# Patient Record
Sex: Female | Born: 1985 | Race: White | Hispanic: No | Marital: Single | Smoking: Never smoker
Health system: Southern US, Community
[De-identification: ages and names within clinical notes are randomized; demographics above are authoritative.]

## PROBLEM LIST (undated history)

## (undated) ENCOUNTER — Emergency Department (HOSPITAL_COMMUNITY): Admission: EM | Payer: Self-pay | Source: Home / Self Care

## (undated) DIAGNOSIS — D649 Anemia, unspecified: Secondary | ICD-10-CM

## (undated) HISTORY — PX: NO PAST SURGERIES: SHX2092

## (undated) HISTORY — PX: INDUCED ABORTION: SHX677

---

## 2010-06-24 ENCOUNTER — Emergency Department (HOSPITAL_COMMUNITY)
Admission: EM | Admit: 2010-06-24 | Discharge: 2010-06-25 | Payer: Self-pay | Source: Home / Self Care | Admitting: Emergency Medicine

## 2011-12-21 ENCOUNTER — Ambulatory Visit (INDEPENDENT_AMBULATORY_CARE_PROVIDER_SITE_OTHER): Payer: BC Managed Care – PPO | Admitting: Family Medicine

## 2011-12-21 ENCOUNTER — Ambulatory Visit: Payer: BC Managed Care – PPO

## 2011-12-21 VITALS — BP 100/68 | HR 109 | Temp 98.3°F | Resp 16 | Ht 63.5 in | Wt 138.0 lb

## 2011-12-21 DIAGNOSIS — M79609 Pain in unspecified limb: Secondary | ICD-10-CM

## 2011-12-21 DIAGNOSIS — M7750 Other enthesopathy of unspecified foot: Secondary | ICD-10-CM

## 2011-12-21 DIAGNOSIS — M79673 Pain in unspecified foot: Secondary | ICD-10-CM

## 2011-12-21 MED ORDER — PREDNISONE 20 MG PO TABS: 20.0000 mg | ORAL_TABLET | Freq: Two times a day (BID) | ORAL | Status: AC

## 2011-12-21 MED ORDER — HYDROCODONE-ACETAMINOPHEN 5-500 MG PO TABS: 1.0000 | ORAL_TABLET | Freq: Three times a day (TID) | ORAL | Status: AC | PRN

## 2011-12-21 NOTE — Progress Notes (Signed)
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 She presents with right foot pain having gone the [place] on Friday night. She walk perhaps 30 miles in the memory of her aunt. She noticed black and blue about her right ankle on the lateral side and stopped walking. Following this she developed pain over the fifth metatarsal of the right foot and has had pain with weightbearing ever since. Of note, patient was wearing flip-flops entire time Friday night. She has blisters on her left foot which are not bothering her particularly right now.  Patient has pain with weightbearing on the right foot and has a blister over the Achilles area as well.  Objective: No acute distress  Patient is tender over the right fifth metatarsal at the insertion of the peroneus longus. There is mild erythema in that area as well as swelling. She is unable to bear weight except on her toes.    Her left dorsal foot shows 2 open blisters with surrounding erythema which is mild.  [facility] reading (PRIMARY) by  Dr. Milus Glazier:  Right foot  Negative for bony abn  Assessment:  Peroneal tendonitis, abrasions  Plan:.prednisone 20 bid x 5, vicodin, elevate.  Call if signs of infection (given doxycycline for increasing redness near abrasions) Patient Instructions  Tendinitis Tendinitis is swelling and inflammation of the tendons. Tendons are band-like tissues that connect muscle to bone. Tendinitis commonly occurs in the:   Shoulders (rotator cuff).   Heels (Achilles tendon).   Elbows (triceps tendon).  CAUSES Tendinitis is usually caused by overusing the tendon, muscles, and joints involved. When the tissue surrounding a tendon (synovium) becomes inflamed, it is called tenosynovitis. Tendinitis commonly develops in people whose jobs require repetitive motions. SYMPTOMS  Pain.   Tenderness.   Mild swelling.  DIAGNOSIS Tendinitis is usually diagnosed by physical exam. Your caregiver may also order  X-rays or other imaging tests. TREATMENT Your caregiver may recommend certain medicines or exercises for your treatment. HOME CARE INSTRUCTIONS   Use a sling or splint for as long as directed by your caregiver until the pain decreases.   Put ice on the injured area.   Put ice in a plastic bag.   Place a towel between your skin and the bag.   Leave the ice on for 15 to 20 minutes, 3 to 4 times a day.   Avoid using the limb while the tendon is painful. Perform gentle range of motion exercises only as directed by your caregiver. Stop exercises if pain or discomfort increase, unless directed otherwise by your caregiver.   Only take over-the-counter or prescription medicines for pain, discomfort, or fever as directed by your caregiver.  SEEK MEDICAL CARE IF:   Your pain and swelling increase.   You develop new, unexplained symptoms, especially increased numbness in the hands.  MAKE SURE YOU:   Understand these instructions.   Will watch your condition.   Will get help right away if you are not doing well or get worse.  Document Released: 07/04/2000 Document Revised: 06/26/2011 Document Reviewed: 09/23/2010 [facility] Patient Information 2012 [place], [place].

## 2011-12-21 NOTE — Patient Instructions (Signed)
Tendinitis  Tendinitis is swelling and inflammation of the tendons. Tendons are band-like tissues that connect muscle to bone. Tendinitis commonly occurs in the:    Shoulders (rotator cuff).   Heels (Achilles tendon).   Elbows (triceps tendon).  CAUSES  Tendinitis is usually caused by overusing the tendon, muscles, and joints involved. When the tissue surrounding a tendon (synovium) becomes inflamed, it is called tenosynovitis. Tendinitis commonly develops in people whose jobs require repetitive motions.  SYMPTOMS   Pain.   Tenderness.   Mild swelling.  DIAGNOSIS  Tendinitis is usually diagnosed by physical exam. Your caregiver may also order X-rays or other imaging tests.  TREATMENT  Your caregiver may recommend certain medicines or exercises for your treatment.  HOME CARE INSTRUCTIONS    Use a sling or splint for as long as directed by your caregiver until the pain decreases.   Put ice on the injured area.   Put ice in a plastic bag.   Place a towel between your skin and the bag.   Leave the ice on for 15 to 20 minutes, 3 to 4 times a day.   Avoid using the limb while the tendon is painful. Perform gentle range of motion exercises only as directed by your caregiver. Stop exercises if pain or discomfort increase, unless directed otherwise by your caregiver.   Only take over-the-counter or prescription medicines for pain, discomfort, or fever as directed by your caregiver.  SEEK MEDICAL CARE IF:    Your pain and swelling increase.   You develop new, unexplained symptoms, especially increased numbness in the hands.  MAKE SURE YOU:    Understand these instructions.   Will watch your condition.   Will get help right away if you are not doing well or get worse.  Document Released: 07/04/2000 Document Revised: 06/26/2011 Document Reviewed: 09/23/2010  [employer] Patient Information 2012 [employer].

## 2011-12-24 ENCOUNTER — Ambulatory Visit
Admission: RE | Admit: 2011-12-24 | Discharge: 2011-12-24 | Disposition: A | Discharge status: Home / Self Care | Payer: BC Managed Care – PPO | Source: Ambulatory Visit | Attending: Family Medicine | Admitting: Family Medicine

## 2011-12-24 ENCOUNTER — Encounter: Payer: Self-pay | Admitting: Family Medicine

## 2011-12-24 ENCOUNTER — Ambulatory Visit (INDEPENDENT_AMBULATORY_CARE_PROVIDER_SITE_OTHER): Payer: BC Managed Care – PPO | Admitting: Family Medicine

## 2011-12-24 VITALS — BP 122/78 | HR 97 | Temp 98.2°F | Resp 19 | Ht 63.5 in | Wt 141.0 lb

## 2011-12-24 DIAGNOSIS — R109 Unspecified abdominal pain: Secondary | ICD-10-CM

## 2011-12-24 LAB — POCT URINE PREGNANCY: Preg Test, Ur: NEGATIVE

## 2011-12-24 LAB — POCT URINALYSIS DIPSTICK
Bilirubin, UA: NEGATIVE
Blood, UA: NEGATIVE
Glucose, UA: NEGATIVE
Ketones, UA: NEGATIVE
Leukocytes, UA: NEGATIVE
Nitrite, UA: NEGATIVE
Protein, UA: NEGATIVE
Spec Grav, UA: 1.01
Urobilinogen, UA: 0.2
pH, UA: 7

## 2011-12-24 LAB — POCT CBC
Granulocyte percent: 72.4 %G (ref 37–80)
HCT, POC: 41.7 % (ref 37.7–47.9)
Hemoglobin: 13.1 g/dL (ref 12.2–16.2)
Lymph, poc: 2.7 (ref 0.6–3.4)
MCH, POC: 29.4 pg (ref 27–31.2)
MCHC: 31.4 g/dL — AB (ref 31.8–35.4)
MCV: 93.7 fL (ref 80–97)
MID (cbc): 1 — AB (ref 0–0.9)
MPV: 9.5 fL (ref 0–99.8)
POC Granulocyte: 9.8 — AB (ref 2–6.9)
POC LYMPH PERCENT: 19.9 %L (ref 10–50)
POC MID %: 7.7 %M (ref 0–12)
Platelet Count, POC: 448 10*3/uL — AB (ref 142–424)
RBC: 4.45 M/uL (ref 4.04–5.48)
RDW, POC: 13.4 %
WBC: 13.6 10*3/uL — AB (ref 4.6–10.2)

## 2011-12-24 LAB — POCT UA - MICROSCOPIC ONLY
Casts, Ur, LPF, POC: NEGATIVE
Crystals, Ur, HPF, POC: NEGATIVE
Mucus, UA: NEGATIVE
RBC, urine, microscopic: NEGATIVE
Yeast, UA: NEGATIVE

## 2011-12-24 MED ORDER — IOHEXOL 300 MG/ML  SOLN
100.0000 mL | Freq: Once | INTRAMUSCULAR | Status: AC | PRN
  Administered 2011-12-24: 100 mL via INTRAVENOUS

## 2011-12-24 NOTE — Patient Instructions (Signed)

## 2011-12-24 NOTE — Progress Notes (Signed)
S:  26 yo woman with right lower abdominal pain today which began in central abdomen which felt like gas, slightly crampy when she woke up this morning.  The  Pain has increased in intensity and is now constant and in right side.  LMP 10 days ago.  Pain is worse when sitting - feels like sharp pain.  Last BM yesterday. Some nausea today. No frequency or burining of urination. No fever   O:  Mildly uncomfortable appearing  Abdomen: tender, very tender at McBurney's point, no  Rebound, no mass Skin: normal Extremities:  Normal inspection. Results for orders placed in visit on 12/24/11  POCT UA - MICROSCOPIC ONLY      Component Value Range   WBC, Ur, HPF, POC 0-1     RBC, urine, microscopic negative     Bacteria, U Microscopic trace     Mucus, UA negative     Epithelial cells, urine per micros 0-1     Crystals, Ur, HPF, POC negative     Casts, Ur, LPF, POC negative     Yeast, UA negative    POCT URINALYSIS DIPSTICK      Component Value Range   Color, UA yellow     Clarity, UA clear     Glucose, UA negative     Bilirubin, UA negative     Ketones, UA negative     Spec Grav, UA 1.010     Blood, UA negative     pH, UA 7.0     Protein, UA negative     Urobilinogen, UA 0.2     Nitrite, UA negative     Leukocytes, UA Negative    POCT CBC      Component Value Range   WBC 13.6 (*) 4.6 - 10.2 (K/uL)   Lymph, poc 2.7  0.6 - 3.4    POC LYMPH PERCENT 19.9  10 - 50 (%L)   MID (cbc) 1.0 (*) 0 - 0.9    POC MID % 7.7  0 - 12 (%M)   POC Granulocyte 9.8 (*) 2 - 6.9    Granulocyte percent 72.4  37 - 80 (%G)   RBC 4.45  4.04 - 5.48 (M/uL)   Hemoglobin 13.1  12.2 - 16.2 (g/dL)   HCT, POC 78.2  95.6 - 47.9 (%)   MCV 93.7  80 - 97 (fL)   MCH, POC 29.4  27 - 31.2 (pg)   MCHC 31.4 (*) 31.8 - 35.4 (g/dL)   RDW, POC 21.3     Platelet Count, POC 448 (*) 142 - 424 (K/uL)   MPV 9.5  0 - 99.8 (fL)  POCT URINE PREGNANCY      Component Value Range   Preg Test, Ur Negative       A:  Acute  abdominal pain, worsening the location is consistent with appendicitis  Plan: Abdominal CT today  CT report today shows moderate stool, no acute findings  Findings were discussed with patient over the phone and she will start MiraLax and I

## 2012-01-27 ENCOUNTER — Ambulatory Visit (INDEPENDENT_AMBULATORY_CARE_PROVIDER_SITE_OTHER): Payer: BC Managed Care – PPO | Admitting: Emergency Medicine

## 2012-01-27 VITALS — BP 110/70 | HR 83 | Temp 98.3°F | Resp 16 | Ht 62.5 in | Wt 140.0 lb

## 2012-01-27 DIAGNOSIS — B029 Zoster without complications: Secondary | ICD-10-CM

## 2012-01-27 MED ORDER — HYDROCODONE-ACETAMINOPHEN 5-500 MG PO TABS: 1.0000 | ORAL_TABLET | ORAL | Status: AC | PRN

## 2012-01-27 MED ORDER — VALACYCLOVIR HCL 1 G PO TABS: 1000.0000 mg | ORAL_TABLET | Freq: Two times a day (BID) | ORAL | Status: DC

## 2012-01-27 NOTE — Patient Instructions (Addendum)

## 2012-01-27 NOTE — Progress Notes (Signed)
   Date:  01/27/2012   Name:  Derek Huneycutt   DOB:  07-31-1985   MRN:  [id number]  PCP:  No primary provider on file.    Chief Complaint: Rash   History of Present Illness:  Kristin Davila is a 26 y.o. very pleasant female patient who presents with the following:  Developed a rash on right check and posterior neck and into scalp.  Describes as itching and burning.  No systemic symptoms  There is no problem list on file for this patient.  No past medical history on file. No past surgical history on file. History  Substance Use Topics  . Smoking status: Never Smoker   . Smokeless tobacco: Not on file  . Alcohol Use: Not on file   No family history on file. Allergies  Allergen Reactions  . Penicillins Hives    Medication list has been reviewed and updated.  No current outpatient prescriptions on file prior to visit.    Review of Systems:  As per HPI, otherwise negative.    Physical Examination: Filed Vitals:   01/27/12 1838  BP: 110/70  Pulse: 83  Temp: 98.3 F (36.8 C)  Resp: 16   Filed Vitals:   01/27/12 1838  Height: 5' 2.5" (1.588 m)  Weight: 140 lb (63.504 kg)   Body mass index is 25.20 kg/(m^2). Ideal Body Weight: Weight in (lb) to have BMI = 25: 138.6    GEN: WDWN, NAD, Non-toxic, Alert & Oriented x 3 HEENT: Atraumatic, Normocephalic.  Ears and Nose: No external deformity. EXTR: No clubbing/cyanosis/edema NEURO: Normal gait.  PSYCH: Normally interactive. Conversant. Not depressed or anxious appearing.  Calm demeanor.  Shingles right cheek and scalp behind ear  EKG / Labs / Xrays: None available at time of encounter  Assessment and Plan: Shingles Valtrex vicodin   Carmelina Dane, MD

## 2012-02-27 ENCOUNTER — Other Ambulatory Visit (HOSPITAL_COMMUNITY)
Admission: RE | Admit: 2012-02-27 | Discharge: 2012-02-27 | Disposition: A | Discharge status: Home / Self Care | Payer: BC Managed Care – PPO | Source: Ambulatory Visit | Attending: Obstetrics and Gynecology | Admitting: Obstetrics and Gynecology

## 2012-02-27 DIAGNOSIS — Z01419 Encounter for gynecological examination (general) (routine) without abnormal findings: Secondary | ICD-10-CM | POA: Insufficient documentation

## 2012-02-27 DIAGNOSIS — Z113 Encounter for screening for infections with a predominantly sexual mode of transmission: Secondary | ICD-10-CM | POA: Insufficient documentation

## 2012-02-27 DIAGNOSIS — Z1151 Encounter for screening for human papillomavirus (HPV): Secondary | ICD-10-CM | POA: Insufficient documentation

## 2012-10-06 ENCOUNTER — Emergency Department (HOSPITAL_COMMUNITY)
Admission: EM | Admit: 2012-10-06 | Discharge: 2012-10-06 | Disposition: A | Discharge status: Home / Self Care | Payer: Self-pay | Attending: Emergency Medicine | Admitting: Emergency Medicine

## 2012-10-06 ENCOUNTER — Emergency Department (HOSPITAL_COMMUNITY): Payer: Self-pay

## 2012-10-06 ENCOUNTER — Encounter (HOSPITAL_COMMUNITY): Payer: Self-pay | Admitting: *Deleted

## 2012-10-06 DIAGNOSIS — Z87828 Personal history of other (healed) physical injury and trauma: Secondary | ICD-10-CM | POA: Insufficient documentation

## 2012-10-06 DIAGNOSIS — M25569 Pain in unspecified knee: Secondary | ICD-10-CM | POA: Insufficient documentation

## 2012-10-06 DIAGNOSIS — G8929 Other chronic pain: Secondary | ICD-10-CM | POA: Insufficient documentation

## 2012-10-06 DIAGNOSIS — M25561 Pain in right knee: Secondary | ICD-10-CM

## 2012-10-06 MED ORDER — IBUPROFEN 800 MG PO TABS
800.0000 mg | ORAL_TABLET | Freq: Once | ORAL | Status: AC
  Administered 2012-10-06: 800 mg via ORAL
  Filled 2012-10-06: qty 1

## 2012-10-06 MED ORDER — IBUPROFEN 800 MG PO TABS: 800.0000 mg | ORAL_TABLET | Freq: Three times a day (TID) | ORAL | Status: DC

## 2012-10-06 NOTE — ED Provider Notes (Signed)
History    This chart was scribed for non-physician practitioner working with Celene Kras, MD by Leone Payor, ED Scribe. This patient was seen in room WTR6/WTR6 and the patient's care was started at 2154.   CSN: [id number]  Arrival date & time 10/06/12  2154   First MD Initiated Contact with Patient 10/06/12 2300      Chief Complaint  Patient presents with  . Knee Pain     The history is provided by the patient. No language interpreter was used.    Kristin Davila is a 27 y.o. female who presents to the Emergency Department complaining of ongoing, constant, gradually worsening right knee pain starting 4-5 months ago with the more recent episode starting 2 days ago. Pt states she ice skates and states she has fallen on her knees in the past but denies any recent injury. She reports having increased pain when walking down stairs and bending down.  She denies fever, chills. States she had a stomach bug 4 days ago with nausea and vomiting that lasted for 1 day but denies fever or rash.   Pt is an occasional alcohol user but denies smoking.  History reviewed. No pertinent past medical history.  History reviewed. No pertinent past surgical history.  No family history on file.  History  Substance Use Topics  . Smoking status: Never Smoker   . Smokeless tobacco: Not on file  . Alcohol Use: Yes     Comment: social    No OB history provided.   Review of Systems A complete 10 system review of systems was obtained and all systems are negative except as noted in the HPI and PMH.   Allergies  Penicillins  Home Medications   Current Outpatient Rx  Name  Route  Sig  Dispense  Refill  . ibuprofen (ADVIL,MOTRIN) 200 MG tablet   Oral   Take 800 mg by mouth every 6 (six) hours as needed for pain or headache.         . Multiple Vitamin (MULTIVITAMIN WITH MINERALS) TABS   Oral   Take 1 tablet by mouth every morning.         Marland Kitchen OVER THE COUNTER MEDICATION   Oral   Take 1 tablet  by mouth 2 (two) times daily as needed (for cold symptoms). Airborne supplement         . ibuprofen (ADVIL,MOTRIN) 800 MG tablet   Oral   Take 1 tablet (800 mg total) by mouth 3 (three) times daily.   21 tablet   0     BP 127/102  Pulse 93  Temp(Src) 98.3 F (36.8 C) (Oral)  Resp 18  SpO2 97%  LMP 09/09/2012  Physical Exam  Nursing note and vitals reviewed. Constitutional: She is oriented to person, place, and time. She appears well-developed and well-nourished. No distress.  HENT:  Head: Normocephalic and atraumatic.  Eyes: EOM are normal.  Neck: Neck supple. No tracheal deviation present.  Cardiovascular: Normal rate.   Pulmonary/Chest: Effort normal. No respiratory distress.  Musculoskeletal: Normal range of motion.  Full extension and flexion to 90 degrees. Prepatellar swelling with tenderness to palpation. Mild bruising. No redness, warmth, or rash.   Neurological: She is alert and oriented to person, place, and time.  Skin: Skin is warm and dry.  Psychiatric: She has a normal mood and affect. Her behavior is normal.    ED Course  Procedures (including critical care time)  DIAGNOSTIC STUDIES: Oxygen Saturation is 97% on room air,  adequate by my interpretation.    COORDINATION OF CARE: 11:11 PM Discussed treatment plan with pt at bedside and pt agreed to plan.    Labs Reviewed - No data to display Dg Knee Complete 4 Views Right  10/06/2012  *RADIOLOGY REPORT*  Clinical Data: Right knee pain in the patellar region for several weeks, worse with bending.  No known injury.  RIGHT KNEE - COMPLETE 4+ VIEW  Comparison: None.  Findings: The right knee appears intact.  No significant effusion. No evidence of acute fracture or subluxation.  No focal bone lesion or bone destruction.  Bone cortex and trabecular architecture appear intact.  No abnormal periosteal reactions.  No radiopaque soft tissue foreign bodies.  IMPRESSION: No acute bony abnormalities demonstrated.    Original Report Authenticated By: Burman Nieves, M.D.      1. Knee pain, acute, right       MDM  Pt c/o of exacerbation of chronic right knee pain.  Increased pain started 2 days ago with increased suprapatellar swelling. Pt did mention having stomach bug 4 days ago but denies fever or rashes.  DDx: septic arthritis, tendonitis, bursitis, muscle strain  With absence of fever, redness, or warmth of right knee, septic arthritis unlikely.   Pt has had negative xrays in the past of right knee.   Will give knee sling and ibuprofen.  Referred to Dr. Ranell Patrick for better management of chronic knee pain.    Vitals: unremarkable. Discharged in stable condition.    Discussed pt with attending during ED encounter.  I personally performed the services described in this documentation, which was scribed in my presence. The recorded information has been reviewed and is accurate.      Junius Finner, PA-C 10/07/12 0001

## 2012-10-06 NOTE — ED Notes (Signed)
Pt c/o right knee pain; no known injury; increased pain when walking down stairs; pain x 2 days

## 2012-10-07 NOTE — ED Provider Notes (Signed)
Medical screening examination/treatment/procedure(s) were performed by non-physician practitioner and as supervising physician I was immediately available for consultation/collaboration.   Zenya Hickam R Kortnee Bas, MD 10/07/12 2347 

## 2013-04-25 ENCOUNTER — Encounter (HOSPITAL_COMMUNITY): Payer: Self-pay | Admitting: Emergency Medicine

## 2013-04-25 ENCOUNTER — Emergency Department (HOSPITAL_COMMUNITY)
Admission: EM | Admit: 2013-04-25 | Discharge: 2013-04-25 | Disposition: A | Discharge status: Home / Self Care | Payer: Self-pay | Attending: Emergency Medicine | Admitting: Emergency Medicine

## 2013-04-25 DIAGNOSIS — F411 Generalized anxiety disorder: Secondary | ICD-10-CM | POA: Insufficient documentation

## 2013-04-25 DIAGNOSIS — T7840XA Allergy, unspecified, initial encounter: Secondary | ICD-10-CM

## 2013-04-25 DIAGNOSIS — N390 Urinary tract infection, site not specified: Secondary | ICD-10-CM | POA: Insufficient documentation

## 2013-04-25 DIAGNOSIS — Z792 Long term (current) use of antibiotics: Secondary | ICD-10-CM | POA: Insufficient documentation

## 2013-04-25 DIAGNOSIS — T491X5A Adverse effect of antipruritics, initial encounter: Secondary | ICD-10-CM | POA: Insufficient documentation

## 2013-04-25 DIAGNOSIS — Z880 Allergy status to penicillin: Secondary | ICD-10-CM | POA: Insufficient documentation

## 2013-04-25 LAB — URINALYSIS, ROUTINE W REFLEX MICROSCOPIC
Nitrite: POSITIVE — AB
Specific Gravity, Urine: 1.028 (ref 1.005–1.030)
Urobilinogen, UA: 2 mg/dL — ABNORMAL HIGH (ref 0.0–1.0)

## 2013-04-25 LAB — URINE MICROSCOPIC-ADD ON

## 2013-04-25 MED ORDER — METHYLPREDNISOLONE SODIUM SUCC 125 MG IJ SOLR
125.0000 mg | Freq: Once | INTRAMUSCULAR | Status: AC
  Administered 2013-04-25: 125 mg via INTRAVENOUS
  Filled 2013-04-25: qty 2

## 2013-04-25 MED ORDER — CIPROFLOXACIN IN D5W 400 MG/200ML IV SOLN
400.0000 mg | Freq: Once | INTRAVENOUS | Status: AC
  Administered 2013-04-25: 400 mg via INTRAVENOUS
  Filled 2013-04-25: qty 200

## 2013-04-25 MED ORDER — SODIUM CHLORIDE 0.9 % IV BOLUS (SEPSIS)
1000.0000 mL | Freq: Once | INTRAVENOUS | Status: AC
  Administered 2013-04-25: 1000 mL via INTRAVENOUS

## 2013-04-25 MED ORDER — DIPHENHYDRAMINE HCL 50 MG/ML IJ SOLN
50.0000 mg | Freq: Once | INTRAMUSCULAR | Status: AC
  Administered 2013-04-25: 50 mg via INTRAVENOUS
  Filled 2013-04-25: qty 1

## 2013-04-25 MED ORDER — FAMOTIDINE IN NACL 20-0.9 MG/50ML-% IV SOLN
20.0000 mg | Freq: Once | INTRAVENOUS | Status: AC
  Administered 2013-04-25: 20 mg via INTRAVENOUS
  Filled 2013-04-25: qty 50

## 2013-04-25 MED ORDER — PREDNISONE 20 MG PO TABS: 40.0000 mg | ORAL_TABLET | Freq: Every day | ORAL | Status: DC

## 2013-04-25 MED ORDER — CIPROFLOXACIN HCL 500 MG PO TABS: 500.0000 mg | ORAL_TABLET | Freq: Two times a day (BID) | ORAL | Status: DC

## 2013-04-25 NOTE — Progress Notes (Signed)
[facility] provided pt with a list of primary care resources and a [facility] [employer] Card application.  °

## 2013-04-25 NOTE — ED Notes (Signed)
Pt began having rash and sob this am. Recently started urostat for UTI. Pt has rash on chest, back, arms and face. Lungs clear. Pt states mouth/throat feels swollen. No swelling noted. Pt ambulatory to room and crying.

## 2013-04-25 NOTE — ED Provider Notes (Signed)
CSN: [id number]     Arrival date & time 04/25/13  4696 History   First MD Initiated Contact with Patient 04/25/13 [phone]     Chief Complaint  Patient presents with  . Allergic Reaction  . Rash   (Consider location/radiation/quality/duration/timing/severity/associated sxs/prior Treatment) The history is provided by the patient.  Kristin Davila is a 27 y.o. female history of UTI here presenting with possible allergic reaction. She had some dysuria this morning and took an over-the-counter Uristat around 7:10 AM. Almost right afterwards she developed a diffuse rash that was itchy. She also has worsening shortness of breath. She had Uristat before and never had this kind of reaction. Denies any other environmental exposure or new foods or new shampoos.    History reviewed. No pertinent past medical history. History reviewed. No pertinent past surgical history. History reviewed. No pertinent family history. History  Substance Use Topics  . Smoking status: Never Smoker   . Smokeless tobacco: Not on file  . Alcohol Use: Yes     Comment: social   OB History   Grav Para Term Preterm Abortions TAB SAB Ect Mult Living                 Review of Systems  Skin: Positive for rash.  All other systems reviewed and are negative.    Allergies  Penicillins  Home Medications   Current Outpatient Rx  Name  Route  Sig  Dispense  Refill  . clindamycin (CLEOCIN) 150 MG capsule   Oral   Take 150 mg by mouth 4 (four) times daily.         . phenazopyridine (PYRIDIUM) 95 MG tablet   Oral   Take 95 mg by mouth 3 (three) times daily as needed for pain.          BP 128/74  Pulse 77  Temp(Src) 97.6 F (36.4 C) (Oral)  Resp 18  SpO2 97% Physical Exam  Nursing note and vitals reviewed. Constitutional: She is oriented to person, place, and time.  Anxious, hyperventilating   HENT:  Head: Normocephalic.  Mouth/Throat: Oropharynx is clear and moist.  Soft palate not swollen. Uvula normal    Eyes: Conjunctivae are normal. Pupils are equal, round, and reactive to light.  Neck: Normal range of motion. Neck supple.  No stridor   Cardiovascular: Normal rate, regular rhythm and normal heart sounds.   Pulmonary/Chest: Effort normal and breath sounds normal. No respiratory distress. She has no wheezes. She has no rales.  Abdominal: Soft. Bowel sounds are normal. She exhibits no distension. There is no tenderness. There is no rebound.  Musculoskeletal: Normal range of motion.  Neurological: She is alert and oriented to person, place, and time.  Skin: Skin is warm and dry.  Psychiatric: She has a normal mood and affect. Her behavior is normal. Judgment and thought content normal.    ED Course  Procedures (including critical care time) Labs Review Labs Reviewed  URINALYSIS, ROUTINE W REFLEX MICROSCOPIC - Abnormal; Notable for the following:    Color, Urine RED (*)    APPearance CLOUDY (*)    Hgb urine dipstick MODERATE (*)    Bilirubin Urine SMALL (*)    Ketones, ur 15 (*)    Protein, ur 30 (*)    Urobilinogen, UA 2.0 (*)    Nitrite POSITIVE (*)    Leukocytes, UA LARGE (*)    All other components within normal limits  URINE MICROSCOPIC-ADD ON - Abnormal; Notable for the following:  Bacteria, UA FEW (*)    All other components within normal limits  URINE CULTURE  PREGNANCY, URINE   Imaging Review No results found.  MDM  No diagnosis found. Kristin Davila is a 27 y.o. female here with allergic reaction. No stridor so will hold off on epi. Will give steroids, benadryl, pepcid and reassess. Will get UA given urinary symptoms.   11:48 AM Rash improved. UA + UTI. Given cipro in the ED with no reaction. Will d/c home with same.     Richardean Canal, MD 04/25/13 [phone]

## 2013-04-26 LAB — URINE CULTURE

## 2013-07-24 ENCOUNTER — Encounter (HOSPITAL_COMMUNITY): Payer: Self-pay | Admitting: Emergency Medicine

## 2013-07-24 ENCOUNTER — Emergency Department (INDEPENDENT_AMBULATORY_CARE_PROVIDER_SITE_OTHER)
Admission: EM | Admit: 2013-07-24 | Discharge: 2013-07-24 | Disposition: A | Discharge status: Home / Self Care | Payer: Worker's Compensation | Source: Home / Self Care | Attending: Family Medicine | Admitting: Family Medicine

## 2013-07-24 DIAGNOSIS — S239XXA Sprain of unspecified parts of thorax, initial encounter: Secondary | ICD-10-CM

## 2013-07-24 DIAGNOSIS — S29012A Strain of muscle and tendon of back wall of thorax, initial encounter: Secondary | ICD-10-CM

## 2013-07-24 MED ORDER — CYCLOBENZAPRINE HCL 10 MG PO TABS: 5.0000 mg | ORAL_TABLET | Freq: Every evening | ORAL | Status: DC | PRN

## 2013-07-24 MED ORDER — HYDROCODONE-ACETAMINOPHEN 5-325 MG PO TABS: 1.0000 | ORAL_TABLET | Freq: Four times a day (QID) | ORAL | Status: DC | PRN

## 2013-07-24 MED ORDER — DICLOFENAC SODIUM 75 MG PO TBEC
75.0000 mg | DELAYED_RELEASE_TABLET | Freq: Two times a day (BID) | ORAL | Status: DC | PRN
Start: 2013-07-24 — End: 2015-03-05

## 2013-07-24 NOTE — ED Provider Notes (Signed)
Kristin LintsKristen Davila is a 28 y.o. female who presents to Urgent Care today for left thoracic back pain. 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 She has pain on motion. She denies any radiating pain weakness numbness fevers or chills. Pain is moderate and worse with activity. Patient has tried Tylenol and Aleve which has not helped much.   History reviewed. No pertinent past medical history. History  Substance Use Topics  . Smoking status: Never Smoker   . Smokeless tobacco: Not on file  . Alcohol Use: Not on file   ROS as above Medications reviewed. No current facility-administered medications for this encounter.   Current Outpatient Prescriptions  Medication Sig Dispense Refill  . cyclobenzaprine (FLEXERIL) 10 MG tablet Take 0.5-1 tablets (5-10 mg total) by mouth at bedtime as needed for muscle spasms.  20 tablet  0  . diclofenac (VOLTAREN) 75 MG EC tablet Take 1 tablet (75 mg total) by mouth 2 (two) times daily as needed.  60 tablet  0  . HYDROcodone-acetaminophen (NORCO/VICODIN) 5-325 MG per tablet Take 1 tablet by mouth every 6 (six) hours as needed.  10 tablet  0    Exam:  BP 114/75  Pulse 85  Temp(Src) 98.4 F (36.9 C) (Oral)  Resp 16  SpO2 98%  LMP 06/21/2013 Gen: Well NAD Back: Neck and back nontender to spinal midline. Tender palpation left rhombus area. Pain with left scapular motion. Shoulder motion is otherwise nontender and normal. Negative impingement. Strength is intact throughout. Capillary refill sensation and grip strength are intact.   Assessment and Plan: 28 y.o. female with left rhomboid muscle strain. Plan to treat with NSAIDs, Norco, Flexeril. Additionally his home exercise program heating pad. Followup with Dr. [facility] if not improving. Discussed warning signs or symptoms. Please see discharge instructions. Patient expresses  understanding.      Rodolph BongEvan S Alanis Clift, MD 07/24/13 [id number]

## 2013-07-24 NOTE — Discharge Instructions (Signed)
Thank you for coming in today. Use a heating pad Take diclofenac twice daily starting tomorrow morning Use 5-10 mg of Flexeril at bedtime as needed for muscle spasm.  Use Norco for severe pain. Stay active Do the exercises I showed you Followup with Dr. Katrinka BlazingSmith in one week Come back or go to the emergency room if you notice new weakness new numbness problems walking or bowel or bladder problems.

## 2013-07-24 NOTE — ED Notes (Signed)
Assessment per Dr. Corey. 

## 2015-03-05 ENCOUNTER — Emergency Department (HOSPITAL_COMMUNITY): Payer: No Typology Code available for payment source

## 2015-03-05 ENCOUNTER — Emergency Department (HOSPITAL_COMMUNITY)
Admission: EM | Admit: 2015-03-05 | Discharge: 2015-03-05 | Disposition: A | Discharge status: Home / Self Care | Payer: No Typology Code available for payment source | Attending: Emergency Medicine | Admitting: Emergency Medicine

## 2015-03-05 ENCOUNTER — Encounter (HOSPITAL_COMMUNITY): Payer: Self-pay | Admitting: *Deleted

## 2015-03-05 DIAGNOSIS — Y9355 Activity, bike riding: Secondary | ICD-10-CM | POA: Insufficient documentation

## 2015-03-05 DIAGNOSIS — Y92410 Unspecified street and highway as the place of occurrence of the external cause: Secondary | ICD-10-CM | POA: Diagnosis not present

## 2015-03-05 DIAGNOSIS — Y998 Other external cause status: Secondary | ICD-10-CM | POA: Insufficient documentation

## 2015-03-05 DIAGNOSIS — Z880 Allergy status to penicillin: Secondary | ICD-10-CM | POA: Diagnosis not present

## 2015-03-05 DIAGNOSIS — S80811A Abrasion, right lower leg, initial encounter: Secondary | ICD-10-CM | POA: Diagnosis not present

## 2015-03-05 DIAGNOSIS — S9002XA Contusion of left ankle, initial encounter: Secondary | ICD-10-CM | POA: Insufficient documentation

## 2015-03-05 DIAGNOSIS — S80812A Abrasion, left lower leg, initial encounter: Secondary | ICD-10-CM | POA: Diagnosis not present

## 2015-03-05 DIAGNOSIS — S99912A Unspecified injury of left ankle, initial encounter: Secondary | ICD-10-CM | POA: Diagnosis present

## 2015-03-05 MED ORDER — IBUPROFEN 800 MG PO TABS
800.0000 mg | ORAL_TABLET | Freq: Three times a day (TID) | ORAL | Status: DC
Start: 2015-03-05 — End: 2016-06-02

## 2015-03-05 MED ORDER — IBUPROFEN 400 MG PO TABS
800.0000 mg | ORAL_TABLET | Freq: Once | ORAL | Status: AC
  Administered 2015-03-05: 800 mg via ORAL
  Filled 2015-03-05: qty 2
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
## 2015-03-05 NOTE — ED Notes (Signed)
Patient in sub wait

## 2015-03-05 NOTE — ED Notes (Signed)
Patient returned from X-ray 

## 2015-03-05 NOTE — Discharge Instructions (Signed)
Contusion °A contusion is a deep bruise. Contusions are the result of an injury that caused bleeding under the skin. The contusion may turn blue, purple, or yellow. Minor injuries will give you a painless contusion, but more severe contusions may stay painful and swollen for a few weeks.  °CAUSES  °A contusion is usually caused by a blow, trauma, or direct force to an area of the body. °SYMPTOMS  °· Swelling and redness of the injured area. °· Bruising of the injured area. °· Tenderness and soreness of the injured area. °· Pain. °DIAGNOSIS  °The diagnosis can be made by taking a history and physical exam. An X-ray, CT scan, or MRI may be needed to determine if there were any associated injuries, such as fractures. °TREATMENT  °Specific treatment will depend on what area of the body was injured. In general, the best treatment for a contusion is resting, icing, elevating, and applying cold compresses to the injured area. Over-the-counter medicines may also be recommended for pain control. Ask your caregiver what the best treatment is for your contusion. °HOME CARE INSTRUCTIONS  °· Put ice on the injured area. °¨ Put ice in a plastic bag. °¨ Place a towel between your skin and the bag. °¨ Leave the ice on for 15-20 minutes, 3-4 times a day, or as directed by your health care provider. °· Only take over-the-counter or prescription medicines for pain, discomfort, or fever as directed by your caregiver. Your caregiver may recommend avoiding anti-inflammatory medicines (aspirin, ibuprofen, and naproxen) for 48 hours because these medicines may increase bruising. °· Rest the injured area. °· If possible, elevate the injured area to reduce swelling. °SEEK IMMEDIATE MEDICAL CARE IF:  °· You have increased bruising or swelling. °· You have pain that is getting worse. °· Your swelling or pain is not relieved with medicines. °MAKE SURE YOU:  °· Understand these instructions. °· Will watch your condition. °· Will get help right  away if you are not doing well or get worse. °Document Released: 04/16/2005 Document Revised: 07/12/2013 Document Reviewed: 05/12/2011 °[employer]® Patient Information ©2015 [employer]. This information is not intended to replace advice given to you by your health care provider. Make sure you discuss any questions you have with your health care provider. ° °

## 2015-03-05 NOTE — ED Notes (Signed)
Patient transported to X-ray 
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
## 2016-06-02 ENCOUNTER — Encounter (HOSPITAL_COMMUNITY): Payer: Self-pay | Admitting: Emergency Medicine

## 2016-06-02 ENCOUNTER — Emergency Department (HOSPITAL_COMMUNITY): Payer: Medicaid Other

## 2016-06-02 ENCOUNTER — Emergency Department (HOSPITAL_COMMUNITY)
Admission: EM | Admit: 2016-06-02 | Discharge: 2016-06-02 | Disposition: A | Discharge status: Home / Self Care | Payer: Medicaid Other | Attending: Emergency Medicine | Admitting: Emergency Medicine

## 2016-06-02 DIAGNOSIS — R1031 Right lower quadrant pain: Secondary | ICD-10-CM

## 2016-06-02 DIAGNOSIS — R1011 Right upper quadrant pain: Secondary | ICD-10-CM | POA: Insufficient documentation

## 2016-06-02 DIAGNOSIS — N939 Abnormal uterine and vaginal bleeding, unspecified: Secondary | ICD-10-CM

## 2016-06-02 DIAGNOSIS — IMO0002 Reserved for concepts with insufficient information to code with codable children: Secondary | ICD-10-CM

## 2016-06-02 HISTORY — DX: Anemia, unspecified: D64.9

## 2016-06-02 LAB — URINALYSIS, ROUTINE W REFLEX MICROSCOPIC
BILIRUBIN URINE: NEGATIVE
Glucose, UA: NEGATIVE mg/dL
Ketones, ur: NEGATIVE mg/dL
NITRITE: NEGATIVE
PROTEIN: 100 mg/dL — AB
SPECIFIC GRAVITY, URINE: 1.025 (ref 1.005–1.030)
pH: 5.5 (ref 5.0–8.0)

## 2016-06-02 LAB — COMPREHENSIVE METABOLIC PANEL
ALBUMIN: 3.2 g/dL — AB (ref 3.5–5.0)
ALT: 12 U/L — ABNORMAL LOW (ref 14–54)
AST: 18 U/L (ref 15–41)
Alkaline Phosphatase: 151 U/L — ABNORMAL HIGH (ref 38–126)
Anion gap: 8 (ref 5–15)
BUN: 14 mg/dL (ref 6–20)
CHLORIDE: 105 mmol/L (ref 101–111)
CO2: 25 mmol/L (ref 22–32)
Calcium: 8.7 mg/dL — ABNORMAL LOW (ref 8.9–10.3)
Creatinine, Ser: 0.99 mg/dL (ref 0.44–1.00)
GFR calc Af Amer: >60 mL/min (ref >60)
Glucose, Bld: 88 mg/dL (ref 65–99)
POTASSIUM: 3.4 mmol/L — AB (ref 3.5–5.1)
SODIUM: 138 mmol/L (ref 135–145)
Total Bilirubin: 0.4 mg/dL (ref 0.3–1.2)
Total Protein: 7.7 g/dL (ref 6.5–8.1)

## 2016-06-02 LAB — CBC
HEMATOCRIT: 33.3 % — AB (ref 36.0–46.0)
Hemoglobin: 10.4 g/dL — ABNORMAL LOW (ref 12.0–15.0)
MCH: 24.9 pg — AB (ref 26.0–34.0)
MCHC: 31.2 g/dL (ref 30.0–36.0)
MCV: 79.7 fL (ref 78.0–100.0)
Platelets: 523 10*3/uL — ABNORMAL HIGH (ref 150–400)
RBC: 4.18 MIL/uL (ref 3.87–5.11)
RDW: 15.4 % (ref 11.5–15.5)
WBC: 10.6 10*3/uL — AB (ref 4.0–10.5)

## 2016-06-02 LAB — PREGNANCY, URINE: PREG TEST UR: POSITIVE — AB

## 2016-06-02 LAB — URINE MICROSCOPIC-ADD ON

## 2016-06-02 MED ORDER — MORPHINE SULFATE (PF) 4 MG/ML IV SOLN
4.0000 mg | Freq: Once | INTRAVENOUS | Status: AC
  Administered 2016-06-02: 4 mg via INTRAVENOUS
  Filled 2016-06-02: qty 1

## 2016-06-02 MED ORDER — METRONIDAZOLE IN NACL 5-0.79 MG/ML-% IV SOLN
500.0000 mg | Freq: Once | INTRAVENOUS | Status: AC
  Administered 2016-06-02: 500 mg via INTRAVENOUS
  Filled 2016-06-02: qty 100

## 2016-06-02 MED ORDER — IOPAMIDOL (ISOVUE-300) INJECTION 61%
INTRAVENOUS | Status: AC
  Administered 2016-06-02: 100 mL
  Filled 2016-06-02: qty 100

## 2016-06-02 MED ORDER — MORPHINE SULFATE (PF) 4 MG/ML IV SOLN
4.0000 mg | Freq: Once | INTRAVENOUS | Status: AC
Start: 2016-06-02 — End: 2016-06-02
  Administered 2016-06-02: 4 mg via INTRAVENOUS
  Filled 2016-06-02: qty 1

## 2016-06-02 MED ORDER — METRONIDAZOLE 500 MG PO TABS: 500.0000 mg | ORAL_TABLET | Freq: Two times a day (BID) | ORAL | 0 refills | Status: DC

## 2016-06-02 MED ORDER — FLUCONAZOLE 200 MG PO TABS: 200.0000 mg | ORAL_TABLET | Freq: Once | ORAL | 0 refills | Status: AC

## 2016-06-02 MED ORDER — HYDROMORPHONE HCL 2 MG/ML IJ SOLN
0.5000 mg | Freq: Once | INTRAMUSCULAR | Status: AC
  Administered 2016-06-02: 0.5 mg via INTRAVENOUS
  Filled 2016-06-02: qty 1

## 2016-06-02 MED ORDER — OXYCODONE-ACETAMINOPHEN 5-325 MG PO TABS: 1.0000 | ORAL_TABLET | ORAL | 0 refills | Status: DC | PRN

## 2016-06-02 MED ORDER — SODIUM CHLORIDE 0.9 % IV BOLUS (SEPSIS)
1000.0000 mL | Freq: Once | INTRAVENOUS | Status: AC
  Administered 2016-06-02: 1000 mL via INTRAVENOUS

## 2016-06-02 MED ORDER — CIPROFLOXACIN IN D5W 400 MG/200ML IV SOLN
400.0000 mg | Freq: Once | INTRAVENOUS | Status: AC
  Administered 2016-06-02: 400 mg via INTRAVENOUS
  Filled 2016-06-02: qty 200

## 2016-06-02 MED ORDER — CIPROFLOXACIN HCL 500 MG PO TABS: 500.0000 mg | ORAL_TABLET | Freq: Two times a day (BID) | ORAL | 0 refills | Status: DC

## 2016-06-02 MED ORDER — MISOPROSTOL 200 MCG PO TABS
800.0000 ug | ORAL_TABLET | Freq: Once | ORAL | Status: AC
  Administered 2016-06-02: 800 ug via ORAL
  Filled 2016-06-02: qty 4
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
## 2016-06-02 NOTE — ED Notes (Signed)
Taken to US.

## 2016-06-02 NOTE — ED Provider Notes (Signed)
PROGRESS NOTE                                                                                                                 This is a sign-out from PA Muthersbaugh at shift change: Kristin Davila is a 30 y.o. female Primapara, 2 weeks postpartum presenting with increasing lower abdominal pain and increasing vaginal bleeding onset yesterday. Abdominal exam concerning for significant right lower quadrant tenderness with possible rebound. Pelvic exam with tissue, orange size, removed. There was a question of possible foreign body in the vaginal vault this was explored fully and nothing was visualized. CT with enlarged appendix, patient pending pelvic ultrasound. Patient is given prescription via phone by her OB/GYN for endometritis, she did not pick up the prescription yesterday. Patient afebrile overall nontoxic appearing per report. Please refer to previous note for full HPI, ROS, PMH and PE.   OB: [facility]. [facility].  Patient seen and evaluated the bedside, appears to be in acute pain, lung sounds clear to auscultation, heart is a regular rate and rhythm with no tachycardia. Abdominal exam with tenderness to palpation in the right lower and suprapubic area with no guarding or rebound. Rovsing, psoas and obturator are negative.  Gen. surgery has evaluated this patient and agrees that this is unlikely appendicitis, cleared for OB/GYN evaluation.  OB/GYN consult from Dr. Adrian BlackwaterStinson appreciated: States that this can be managed as an outpatient, he recommends 800 g of Cytotec buccally and Augmentin at home with follow-up in one week.  Repeat abdominal exam remains benign. She has a penicillin allergy, will give Cipro Flagyl. Urinalysis looks contaminated and this is largely from the intravaginal processes. Repeat abdominal exam remains benign, I doubt this is true appendicitis. Extensive discussion of return precautions and patient verbalized her  understanding and teach back technique.  Vitals:   06/02/16 1100 06/02/16 1130 06/02/16 1200 06/02/16 1307  BP: 117/71 125/69 136/88 129/69  Pulse: (!) 58 61 65 62  Resp: 14  15   Temp:    99.1 F (37.3 C)  TempSrc:    Oral  SpO2: 100% 100% 100% 99%    Medications  sodium chloride 0.9 % bolus 1,000 mL (0 mLs Intravenous Stopped 06/02/16 0610)  morphine 4 MG/ML injection 4 mg (4 mg Intravenous Given 06/02/16 0350)  iopamidol (ISOVUE-300) 61 % injection (100 mLs  Contrast Given 06/02/16 0456)  morphine 4 MG/ML injection 4 mg (4 mg Intravenous Given 06/02/16 0523)  ciprofloxacin (CIPRO) IVPB 400 mg (0 mg Intravenous Stopped 06/02/16 0844)    And  metroNIDAZOLE (FLAGYL) IVPB 500 mg (0 mg Intravenous Stopped 06/02/16 0844)  morphine 4 MG/ML injection 4 mg (4 mg Intravenous Given 06/02/16 0743)  HYDROmorphone (DILAUDID) injection 0.5 mg (0.5 mg Intravenous Given 06/02/16 0921)  misoprostol (CYTOTEC) tablet 800 mcg (800 mcg Oral Given 06/02/16 0952)  HYDROmorphone (DILAUDID) injection 0.5 mg (0.5 mg Intravenous Given 06/02/16 1212)    Evaluation does not show pathology that would require ongoing emergent intervention or  inpatient treatment. Pt is hemodynamically stable and mentating appropriately. Discussed findings and plan with patient/guardian, who agrees with care plan. All questions answered. Return precautions discussed and outpatient follow up given.       Wynetta Emeryicole Makael Stein, PA-C 06/02/16 1447    April Palumbo, MD 06/03/16 [phone]

## 2016-06-02 NOTE — Consult Note (Signed)
Reason for Consult: Possible appendicitis Referring Physician: Monico Blitz PA-C (ED)  Kristin Davila is an 30 y.o. female.  HPI: 30 y/o Kristin Davila 2 weeks presents with abdominal pain, and increased vaginal bleeding.  Pt reports her bleeding had almost stopped and then on late Friday or Saturday the pain and marked increase in vaginal bleeding began.  Pain and increased bleeding has been ongoing since that time. She vomited once, they think on Friday PM, but none since.  She has had ongoing severe abdominal pain, unable to care for the baby.  She has been able to eat some.  She presented to the ED with increased vaginal bleeding and severe pain both left and right lower abdomen.  She says pain is worse on palpation on the right.    Work up in the ED shows she is afebrile. On Pelvic exam she has an orange sized tissue was removed from the vagina vault along with several blood clots by the ED staff.  Labs show and elevated WBC 10.6, Positive pregnancy test, TNTC RBC's, and some WBC in urine. US Pelvis, and Transvaginal Non-OB Probable retained products of conception. Appropriate size of the uterus for the postpartum state. Normal appearance of the ovaries and adnexal regions.  CT of the abdomen and pelvis shows: . Right appendix is borderline in size measuring 7 mm containing high attenuation material possibly representing vicarious contrast excretion. The appendix has increased in size in comparison with prior CT of the abdomen and pelvis. No definite secondary signs of acute appendicitis. Otherwise no evidence of bowel wall thickening, distention, or inflammatory changes.  We are ask to see.  Past Medical History:  Diagnosis Date  . Anemia     History reviewed. No pertinent surgical history.  No family history on file.  Social History:  reports that she has never smoked. She uses smokeless tobacco. She reports that she drinks alcohol. She reports that she does not use  drugs.  Allergies:  Allergies  Allergen Reactions  . Azo [Phenazopyridine] Anaphylaxis  . Penicillins Hives    Prior to Admission medications   Medication Sig Start Date End Date Taking? Authorizing Provider  ferrous sulfate 325 (65 FE) MG tablet Take 325 mg by mouth 2 (two) times daily with a meal.   Yes Historical Provider, MD  Prenatal Vit-Fe Fumarate-FA (PRENATAL MULTIVITAMIN) TABS tablet Take 1 tablet by mouth daily at 12 noon.   Yes Historical Provider, MD     Results for orders placed or performed during the hospital encounter of 06/02/16 (from the past 48 hour(s))  Comprehensive metabolic panel     Status: Abnormal   Collection Time: 06/02/16  2:18 AM  Result Value Ref Range   Sodium 138 135 - 145 mmol/L   Potassium 3.4 (L) 3.5 - 5.1 mmol/L   Chloride 105 101 - 111 mmol/L   CO2 25 22 - 32 mmol/L   Glucose, Bld 88 65 - 99 mg/dL   BUN 14 6 - 20 mg/dL   Creatinine, Ser 0.99 0.44 - 1.00 mg/dL   Calcium 8.7 (L) 8.9 - 10.3 mg/dL   Total Protein 7.7 6.5 - 8.1 g/dL   Albumin 3.2 (L) 3.5 - 5.0 g/dL   AST 18 15 - 41 U/L   ALT 12 (L) 14 - 54 U/L   Alkaline Phosphatase 151 (H) 38 - 126 U/L   Total Bilirubin 0.4 0.3 - 1.2 mg/dL   GFR calc non Af Amer >60 >60 mL/min   GFR calc Af  Amer >60 >60 mL/min    Comment: (NOTE) The eGFR has been calculated using the CKD EPI equation. This calculation has not been validated in all clinical situations. eGFR's persistently <60 mL/min signify possible Chronic Kidney Disease.    Anion gap 8 5 - 15  CBC     Status: Abnormal   Collection Time: 06/02/16  2:18 AM  Result Value Ref Range   WBC 10.6 (H) 4.0 - 10.5 K/uL   RBC 4.18 3.87 - 5.11 MIL/uL   Hemoglobin 10.4 (L) 12.0 - 15.0 g/dL   HCT 33.3 (L) 36.0 - 46.0 %   MCV 79.7 78.0 - 100.0 fL   MCH 24.9 (L) 26.0 - 34.0 pg   MCHC 31.2 30.0 - 36.0 g/dL   RDW 15.4 11.5 - 15.5 %   Platelets 523 (H) 150 - 400 K/uL  Urinalysis, Routine w reflex microscopic (not at [facility])     Status: Abnormal    Collection Time: 06/02/16  3:39 AM  Result Value Ref Range   Color, Urine RED (A) YELLOW    Comment: BIOCHEMICALS MAY BE AFFECTED BY COLOR   APPearance CLOUDY (A) CLEAR   Specific Gravity, Urine 1.025 1.005 - 1.030   pH 5.5 5.0 - 8.0   Glucose, UA NEGATIVE NEGATIVE mg/dL   Hgb urine dipstick LARGE (A) NEGATIVE   Bilirubin Urine NEGATIVE NEGATIVE   Ketones, ur NEGATIVE NEGATIVE mg/dL   Protein, ur 100 (A) NEGATIVE mg/dL   Nitrite NEGATIVE NEGATIVE   Leukocytes, UA MODERATE (A) NEGATIVE  Pregnancy, urine     Status: Abnormal   Collection Time: 06/02/16  3:39 AM  Result Value Ref Range   Preg Test, Ur POSITIVE (A) NEGATIVE    Comment:        THE SENSITIVITY OF THIS METHODOLOGY IS >20 mIU/mL.   Urine microscopic-add on     Status: Abnormal   Collection Time: 06/02/16  3:39 AM  Result Value Ref Range   Squamous Epithelial / LPF 6-30 (A) NONE SEEN   WBC, UA 6-30 0 - 5 WBC/hpf   RBC / HPF TOO NUMEROUS TO COUNT 0 - 5 RBC/hpf   Bacteria, UA MANY (A) NONE SEEN    US Transvaginal Non-ob  Result Date: 06/02/2016 CLINICAL DATA:  Two weeks postpartum. Bleeding initially stopped after delivery and in re- occurred heavily 2 days ago. EXAM: TRANSABDOMINAL AND TRANSVAGINAL ULTRASOUND OF PELVIS DOPPLER ULTRASOUND OF OVARIES TECHNIQUE: Both transabdominal and transvaginal ultrasound examinations of the pelvis were performed. Transabdominal technique was performed for global imaging of the pelvis including uterus, ovaries, adnexal regions, and pelvic cul-de-sac. It was necessary to proceed with endovaginal exam following the transabdominal exam to visualize the uterus, endometrium, ovaries, and adnexal regions. Color and duplex Doppler ultrasound was utilized to evaluate blood flow to the ovaries. COMPARISON:  CT scan of the abdomen and pelvis dated June 02, 2016 FINDINGS: Uterus Measurements: 13.8 x 7.4 x 7.7 cm. The myometrial echotexture is normal. Endometrium Thickness: 12.8 cm in thickness in  the fundus and 8.4 mm in the body. There is mixed echogenicity material present within the endometrial cavity with without increased vascularity Right ovary Measurements: 3.6 x 3.0 x 1.8 cm. Normal appearance/no adnexal mass. Left ovary Measurements: 4.9 x 1.3 x 2.1 cm. Normal appearance/no adnexal mass. Pulsed Doppler evaluation of both ovaries demonstrates normal low-resistance arterial and venous waveforms. Other findings No abnormal free fluid. IMPRESSION: Probable retained products of conception. Appropriate size of the uterus for the postpartum state. Normal appearance of the  ovaries and adnexal regions. Electronically Signed   By: David  Martinique M.D.   On: 06/02/2016 07:06   US Pelvis Complete  Result Date: 06/02/2016 CLINICAL DATA:  Two weeks postpartum. Bleeding initially stopped after delivery and in re- occurred heavily 2 days ago. EXAM: TRANSABDOMINAL AND TRANSVAGINAL ULTRASOUND OF PELVIS DOPPLER ULTRASOUND OF OVARIES TECHNIQUE: Both transabdominal and transvaginal ultrasound examinations of the pelvis were performed. Transabdominal technique was performed for global imaging of the pelvis including uterus, ovaries, adnexal regions, and pelvic cul-de-sac. It was necessary to proceed with endovaginal exam following the transabdominal exam to visualize the uterus, endometrium, ovaries, and adnexal regions. Color and duplex Doppler ultrasound was utilized to evaluate blood flow to the ovaries. COMPARISON:  CT scan of the abdomen and pelvis dated June 02, 2016 FINDINGS: Uterus Measurements: 13.8 x 7.4 x 7.7 cm. The myometrial echotexture is normal. Endometrium Thickness: 12.8 cm in thickness in the fundus and 8.4 mm in the body. There is mixed echogenicity material present within the endometrial cavity with without increased vascularity Right ovary Measurements: 3.6 x 3.0 x 1.8 cm. Normal appearance/no adnexal mass. Left ovary Measurements: 4.9 x 1.3 x 2.1 cm. Normal appearance/no adnexal mass. Pulsed  Doppler evaluation of both ovaries demonstrates normal low-resistance arterial and venous waveforms. Other findings No abnormal free fluid. IMPRESSION: Probable retained products of conception. Appropriate size of the uterus for the postpartum state. Normal appearance of the ovaries and adnexal regions. Electronically Signed   By: David  Martinique M.D.   On: 06/02/2016 07:06   Ct Abdomen Pelvis W Contrast  Result Date: 06/02/2016 CLINICAL DATA:  30 y/o F; right lower quadrant abdominal pain, vaginal bleeding, 2 weeks postpartum vaginal delivery. EXAM: CT ABDOMEN AND PELVIS WITH CONTRAST TECHNIQUE: Multidetector CT imaging of the abdomen and pelvis was performed using the standard protocol following bolus administration of intravenous contrast. CONTRAST:  1 ISOVUE-300 IOPAMIDOL (ISOVUE-300) INJECTION 61% COMPARISON:  12/24/2011 CT abdomen and pelvis. FINDINGS: Lower chest: No acute abnormality. Hepatobiliary: No focal liver lesion. Dependent high attenuation within the gallbladder likely represents vicarious contrast excretion. No intra or extrahepatic biliary ductal dilatation. Pancreas: Unremarkable. No pancreatic ductal dilatation or surrounding inflammatory changes. Spleen: Normal in size without focal abnormality. Adrenals/Urinary Tract: Right kidney striated nephrogram. Normal adrenal glands. No obstructive uropathy or urinary stone disease. Normal bladder. Stomach/Bowel: Stomach is within normal limits. Right appendix is borderline in size measuring 7 mm containing high attenuation material possibly representing vicarious contrast excretion. The appendix has increased in size in comparison with prior CT of the abdomen and pelvis. No definite secondary signs of acute appendicitis. Otherwise no evidence of bowel wall thickening, distention, or inflammatory changes. Vascular/Lymphatic: No significant vascular findings are present. No enlarged abdominal or pelvic lymph nodes. Reproductive: Uterus is markedly  enlarged with heterogeneous myometrium and punctate focus of gas in the endometrial canal which are findings postpartum for several weeks. Poor venous opacification to assess for venous thrombosis. Small cluster of vessels at the dorsal margin of the endometrial cavity (series 303, image 62). Other: No abdominal wall hernia or abnormality. No abdominopelvic ascites. Musculoskeletal: No acute or significant osseous findings. IMPRESSION: 1. Borderline appendix size, increased in comparison with prior CT, without secondary signs of acute appendicitis is indeterminate. 2. Mild striated right kidney nephrogram can be seen in the setting of pyelonephritis, tubular necrosis, vascular injury or sequelae obstruction. No hydronephrosis at this time. 3. Postpartum appearance of the uterus. Small cluster vessels of the dorsal margin of the endometrial cavity in the setting  of vaginal bleeding raises question retained products or vascular malformation. Pelvic ultrasound is recommended. Electronically Signed   By: Kristine Garbe M.D.   On: 06/02/2016 05:36   [place] Art/ven Flow Abd Pelv Doppler  Result Date: 06/02/2016 CLINICAL DATA:  Two weeks postpartum. Bleeding initially stopped after delivery and in re- occurred heavily 2 days ago. EXAM: TRANSABDOMINAL AND TRANSVAGINAL ULTRASOUND OF PELVIS DOPPLER ULTRASOUND OF OVARIES TECHNIQUE: Both transabdominal and transvaginal ultrasound examinations of the pelvis were performed. Transabdominal technique was performed for global imaging of the pelvis including uterus, ovaries, adnexal regions, and pelvic cul-de-sac. It was necessary to proceed with endovaginal exam following the transabdominal exam to visualize the uterus, endometrium, ovaries, and adnexal regions. Color and duplex Doppler ultrasound was utilized to evaluate blood flow to the ovaries. COMPARISON:  CT scan of the abdomen and pelvis dated June 02, 2016 FINDINGS: Uterus Measurements: 13.8 x 7.4 x 7.7 cm.  The myometrial echotexture is normal. Endometrium Thickness: 12.8 cm in thickness in the fundus and 8.4 mm in the body. There is mixed echogenicity material present within the endometrial cavity with without increased vascularity Right ovary Measurements: 3.6 x 3.0 x 1.8 cm. Normal appearance/no adnexal mass. Left ovary Measurements: 4.9 x 1.3 x 2.1 cm. Normal appearance/no adnexal mass. Pulsed Doppler evaluation of both ovaries demonstrates normal low-resistance arterial and venous waveforms. Other findings No abnormal free fluid. IMPRESSION: Probable retained products of conception. Appropriate size of the uterus for the postpartum state. Normal appearance of the ovaries and adnexal regions. Electronically Signed   By: David  Martinique M.D.   On: 06/02/2016 07:06    Review of Systems  Constitutional: Negative for diaphoresis, fever (she reports taking Motrin and Tylenol every 4 hours since 05/31/16 when pain and bleediing got bad), malaise/fatigue and weight loss.  HENT: Negative.   Eyes: Negative.   Respiratory: Negative.   Cardiovascular: Negative.   Gastrointestinal: Positive for abdominal pain, diarrhea (some over the last day or so), nausea (Nausea and vomitied x 1 11/10 or 11/11, she isn't sure.  None since that time) and vomiting (x 1  2 days ago). Negative for blood in stool, constipation and melena.  Genitourinary: Positive for hematuria.       She reports Kristin Davila bleeding had almost stopped and then 2 days ago resumed with heavy bleeding and abdominal pain  Musculoskeletal: Negative.   Skin: Negative.   Neurological: Negative.  Negative for weakness.  Endo/Heme/Allergies: Negative.   Psychiatric/Behavioral: Negative.    Blood pressure 124/76, pulse 73, temperature 97.9 F (36.6 C), temperature source Oral, resp. rate 16, SpO2 100 %. Physical Exam  Constitutional: She is oriented to person, place, and time. She appears well-developed and well-nourished.  Thin female, pain is better  now with pain meds  HENT:  Head: Normocephalic and atraumatic.  Mouth/Throat: No oropharyngeal exudate.  Eyes: Right eye exhibits no discharge. Left eye exhibits no discharge. No scleral icterus.  Neck: Normal range of motion. Neck supple. No JVD present. No tracheal deviation present. No thyromegaly present.  Cardiovascular: Normal rate, regular rhythm, normal heart sounds and intact distal pulses.   No murmur heard. Respiratory: Effort normal and breath sounds normal. No respiratory distress. She has no wheezes. She has no rales. She exhibits no tenderness.  GI: She exhibits no distension and no mass. There is tenderness. There is no rebound and no guarding.  She is tender Right and left lower abdomen below the umbilicus.  The right side is more tender than the left.  Genitourinary:  Genitourinary Comments: Vaginal exam by ED staff.  See their note. I did not examine.  Musculoskeletal: She exhibits no edema or tenderness.  Lymphadenopathy:    She has no cervical adenopathy.  Neurological: She is alert and oriented to person, place, and time. No cranial nerve deficit.  Skin: Skin is warm and dry. No rash noted. No erythema. No pallor.  Psychiatric: She has a normal mood and affect. Her behavior is normal. Judgment and thought content normal.    Assessment/Plan: Abdominal pain, with increased vaginal bleeding  2 weeks Kristin Davila, normal vaginal delivery Border line appendix 7 mm without evidence of appendicitis  Plan:  Dr. Dalbert Batman has reviewed the CT scan and it is his opinion that the CT shows contrast in the appendix with no evidence of inflammation consistent with appendicitis.  He recommends she be evluated by GYN.      Orla Estrin 06/02/2016, 7:53 AM

## 2016-06-02 NOTE — ED Notes (Signed)
ED Provider at bedside. 

## 2016-06-02 NOTE — Discharge Instructions (Signed)
Take percocet for breakthrough pain, do not drink alcohol, drive, care for children or do other critical tasks while taking percocet.  Do not hesitate to return to the emergency department for any new or worsening symptoms including fever, chills, nausea, vomiting or worsening pain worsening vaginal bleeding.  [facility] a week. If her symptoms of vaginal bleeding worsen please present there immediately.  Do not hesitate to return to the Emergency Department for any new, worsening or concerning symptoms.

## 2016-06-02 NOTE — ED Notes (Signed)
Surgery at bedside.

## 2016-06-02 NOTE — ED Notes (Signed)
Pt. Ambulated to restroom independently at this time. Pt. Reports minimal bleeding with one large clot. EDP made aware.

## 2016-06-02 NOTE — ED Provider Notes (Signed)
[facility]-EMERGENCY DEPT Provider Note   CSN: [id number] Arrival date & time: 06/02/16  0203     History   Chief Complaint Chief Complaint  Patient presents with  . Abdominal Pain  . Postpartum Complications  . Vaginal Bleeding    HPI Kristin Davila is a 30 y.o. female G1 P0101, 2 weeks postpartum presents to the Emergency Department complaining of gradual, persistent, progressively worsening lower abdominal pain with increased vaginal bleeding in the last 48-72 hours. Patient reports she has been taking ibuprofen at home which was initially controlling her pain after birth but is no longer doing so. Patient reports that her bleeding had largely stopped but has increased significantly in the last 24 hours. Patient denies fevers or chills however reports that she has taken ibuprofen continuously. Patient reports she discussed the matter earlier in the day with her OB/GYN who called and an unknown antibiotic that the patient did not attempt to fill this.  Nothing seems to make the symptoms better or worse.  She reports the pain is sharp and stabbing and more localized to the right lower quadrant than anywhere else. She has no history of abdominal surgeries.  OB: [facility]. [facility].  The history is provided by the patient, a significant other and medical records. No language interpreter was used.    Past Medical History:  Diagnosis Date  . Anemia     There are no active problems to display for this patient.   History reviewed. No pertinent surgical history.  OB History    No data available       Home Medications    Prior to Admission medications   Medication Sig Start Date End Date Taking? Authorizing Provider  ferrous sulfate 325 (65 FE) MG tablet Take 325 mg by mouth 2 (two) times daily with a meal.   Yes Historical Provider, MD  Prenatal Vit-Fe Fumarate-FA (PRENATAL MULTIVITAMIN) TABS tablet Take 1 tablet by mouth  daily at 12 noon.   Yes Historical Provider, MD    Family History No family history on file.  Social History Social History  Substance Use Topics  . Smoking status: Never Smoker  . Smokeless tobacco: Current User  . Alcohol use Yes     Comment: ocaqssionally     Allergies   Azo [phenazopyridine] and Penicillins   Review of Systems Review of Systems  Gastrointestinal: Positive for abdominal pain.  Genitourinary: Positive for vaginal bleeding.  All other systems reviewed and are negative.    Physical Exam Updated Vital Signs BP 124/76 (BP Location: Left Arm)   Pulse 73   Temp 97.9 F (36.6 C) (Oral)   Resp 16   SpO2 100%   Physical Exam  Constitutional: She appears well-developed and well-nourished.  Awake, alert, nontoxic appearance  HENT:  Head: Normocephalic and atraumatic.  Mouth/Throat: Oropharynx is clear and moist. No oropharyngeal exudate.  Eyes: Conjunctivae are normal. No scleral icterus.  Neck: Normal range of motion. Neck supple.  Cardiovascular: Normal rate, regular rhythm, normal heart sounds and intact distal pulses.   No murmur heard. Pulmonary/Chest: Effort normal and breath sounds normal. No respiratory distress. She has no wheezes.  Equal chest expansion  Abdominal: Soft. Bowel sounds are normal. She exhibits no mass. There is tenderness in the right lower quadrant. There is rebound and guarding. There is no rigidity. Hernia confirmed negative in the right inguinal area and confirmed negative in the left inguinal area.  Lower abdominal and periumbilical tenderness with  exquisite tenderness in the right lower quadrant with guarding  Genitourinary:    No labial fusion. There is no rash, tenderness or lesion on the right labia. There is no rash, tenderness or lesion on the left labia. Uterus is tender. Uterus is not deviated, not enlarged and not fixed. Cervix exhibits no motion tenderness, no discharge and no friability. Right adnexum displays  tenderness. Right adnexum displays no mass and no fullness. Left adnexum displays no mass, no tenderness and no fullness. There is bleeding in the vagina. No erythema or tenderness in the vagina. No foreign body in the vagina. No signs of injury around the vagina. No vaginal discharge found.  Genitourinary Comments: Several blood clots extracted with questionable large tissue mass extracted from the vaginal vault. First-degree tears appear to be healing. No foreign bodies in the vagina. Uterus is tender to palpation and firm  Musculoskeletal: Normal range of motion. She exhibits no edema.  Lymphadenopathy:       Right: No inguinal adenopathy present.       Left: No inguinal adenopathy present.  Neurological: She is alert.  Speech is clear and goal oriented Moves extremities without ataxia  Skin: Skin is warm and dry. She is not diaphoretic. No erythema.  Psychiatric: She has a normal mood and affect.  Nursing note and vitals reviewed.    ED Treatments / Results  Labs (all labs ordered are listed, but only abnormal results are displayed) Labs Reviewed  COMPREHENSIVE METABOLIC PANEL - Abnormal; Notable for the following:       Result Value   Potassium 3.4 (*)    Calcium 8.7 (*)    Albumin 3.2 (*)    ALT 12 (*)    Alkaline Phosphatase 151 (*)    All other components within normal limits  CBC - Abnormal; Notable for the following:    WBC 10.6 (*)    Hemoglobin 10.4 (*)    HCT 33.3 (*)    MCH 24.9 (*)    Platelets 523 (*)    All other components within normal limits  URINALYSIS, ROUTINE W REFLEX MICROSCOPIC (NOT AT [facility]) - Abnormal; Notable for the following:    Color, Urine RED (*)    APPearance CLOUDY (*)    Hgb urine dipstick LARGE (*)    Protein, ur 100 (*)    Leukocytes, UA MODERATE (*)    All other components within normal limits  PREGNANCY, URINE - Abnormal; Notable for the following:    Preg Test, Ur POSITIVE (*)    All other components within normal limits  URINE  MICROSCOPIC-ADD ON - Abnormal; Notable for the following:    Squamous Epithelial / LPF 6-30 (*)    Bacteria, UA MANY (*)    All other components within normal limits  MISCELLANEOUS TEST - ANATOMIC PATHOLOGY    Radiology Ct Abdomen Pelvis W Contrast  Result Date: 06/02/2016 CLINICAL DATA:  30 y/o F; right lower quadrant abdominal pain, vaginal bleeding, 2 weeks postpartum vaginal delivery. EXAM: CT ABDOMEN AND PELVIS WITH CONTRAST TECHNIQUE: Multidetector CT imaging of the abdomen and pelvis was performed using the standard protocol following bolus administration of intravenous contrast. CONTRAST:  1 ISOVUE-300 IOPAMIDOL (ISOVUE-300) INJECTION 61% COMPARISON:  12/24/2011 CT abdomen and pelvis. FINDINGS: Lower chest: No acute abnormality. Hepatobiliary: No focal liver lesion. Dependent high attenuation within the gallbladder likely represents vicarious contrast excretion. No intra or extrahepatic biliary ductal dilatation. Pancreas: Unremarkable. No pancreatic ductal dilatation or surrounding inflammatory changes. Spleen: Normal in size without focal  abnormality. Adrenals/Urinary Tract: Right kidney striated nephrogram. Normal adrenal glands. No obstructive uropathy or urinary stone disease. Normal bladder. Stomach/Bowel: Stomach is within normal limits. Right appendix is borderline in size measuring 7 mm containing high attenuation material possibly representing vicarious contrast excretion. The appendix has increased in size in comparison with prior CT of the abdomen and pelvis. No definite secondary signs of acute appendicitis. Otherwise no evidence of bowel wall thickening, distention, or inflammatory changes. Vascular/Lymphatic: No significant vascular findings are present. No enlarged abdominal or pelvic lymph nodes. Reproductive: Uterus is markedly enlarged with heterogeneous myometrium and punctate focus of gas in the endometrial canal which are findings postpartum for several weeks. Poor venous  opacification to assess for venous thrombosis. Small cluster of vessels at the dorsal margin of the endometrial cavity (series 303, image 62). Other: No abdominal wall hernia or abnormality. No abdominopelvic ascites. Musculoskeletal: No acute or significant osseous findings. IMPRESSION: 1. Borderline appendix size, increased in comparison with prior CT, without secondary signs of acute appendicitis is indeterminate. 2. Mild striated right kidney nephrogram can be seen in the setting of pyelonephritis, tubular necrosis, vascular injury or sequelae obstruction. No hydronephrosis at this time. 3. Postpartum appearance of the uterus. Small cluster vessels of the dorsal margin of the endometrial cavity in the setting of vaginal bleeding raises question retained products or vascular malformation. Pelvic ultrasound is recommended. Electronically Signed   By: Mitzi Hansen M.D.   On: 06/02/2016 05:36    Procedures Procedures (including critical care time)  Medications Ordered in ED Medications  sodium chloride 0.9 % bolus 1,000 mL (0 mLs Intravenous Stopped 06/02/16 0610)  morphine 4 MG/ML injection 4 mg (4 mg Intravenous Given 06/02/16 0350)  iopamidol (ISOVUE-300) 61 % injection (100 mLs  Contrast Given 06/02/16 0456)  morphine 4 MG/ML injection 4 mg (4 mg Intravenous Given 06/02/16 0523)     Initial Impression / Assessment and Plan / ED Course  I have reviewed the triage vital signs and the nursing notes.  Pertinent labs & imaging results that were available during my care of the patient were reviewed by me and considered in my medical decision making (see chart for details).  Clinical Course     She presents with lower abdominal pain and increased vaginal bleeding in the last 24 hours. Mild anemia noted. Mild leukocytosis.  Patient with significant right lower quadrant abdominal pain with guarding and some rebound.  CT scan shows questionably enlarged appendix without discrete  appendicitis. Also questionable retained products of conception in the uterus. Ultrasound is pending. Patient's pain has improved with 2 doses of morphine. Her bleeding has slowed some.  Signs remained stable throughout her time here in the emergency department.  At shift change care transferred to [facility], PA-C.  Ultrasound is pending.  Pt will need discussion with OB/GYN and possibly surgery dependent upon repeat abd exam and [place] results.    The patient was discussed with Dr. Nicanor Alcon who agrees with the treatment plan.   Final Clinical Impressions(s) / ED Diagnoses   Final diagnoses:  Vaginal bleeding  RLQ abdominal pain  Postpartum bleeding  Vaginal bleeding  RLQ abdominal pain  Postpartum bleeding    New Prescriptions New Prescriptions   No medications on file     Dierdre Forth, PA-C 06/02/16 0703    April Palumbo, MD 06/02/16 [phone]

## 2016-06-02 NOTE — ED Notes (Signed)
Pt. Being discharged given Ginger Ale.

## 2016-06-02 NOTE — ED Notes (Addendum)
Joni ReiningNicole (PA) gave permission to give water to pt.

## 2016-06-02 NOTE — ED Triage Notes (Signed)
Vaginal delivery 2 weeks ago.  Reports increased vaginal bleeding and generalized abd pain.  Concerned about ? Foreign body in vaginal area that "looks like a crooked needle"  Pt has history of anemia.

## 2016-06-14 ENCOUNTER — Encounter (HOSPITAL_COMMUNITY): Payer: Self-pay

## 2016-06-14 ENCOUNTER — Inpatient Hospital Stay (HOSPITAL_COMMUNITY): Payer: Medicaid Other

## 2016-06-14 ENCOUNTER — Inpatient Hospital Stay (HOSPITAL_COMMUNITY)
Admission: AD | Admit: 2016-06-14 | Discharge: 2016-06-14 | Disposition: A | Discharge status: Home / Self Care | Payer: Medicaid Other | Source: Ambulatory Visit | Attending: Obstetrics & Gynecology | Admitting: Obstetrics & Gynecology

## 2016-06-14 DIAGNOSIS — R109 Unspecified abdominal pain: Secondary | ICD-10-CM | POA: Insufficient documentation

## 2016-06-14 DIAGNOSIS — O9089 Other complications of the puerperium, not elsewhere classified: Secondary | ICD-10-CM | POA: Insufficient documentation

## 2016-06-14 DIAGNOSIS — N898 Other specified noninflammatory disorders of vagina: Secondary | ICD-10-CM | POA: Insufficient documentation

## 2016-06-14 LAB — URINALYSIS, ROUTINE W REFLEX MICROSCOPIC
BILIRUBIN URINE: NEGATIVE
Glucose, UA: NEGATIVE mg/dL
KETONES UR: NEGATIVE mg/dL
NITRITE: NEGATIVE
PH: 6 (ref 5.0–8.0)
Protein, ur: NEGATIVE mg/dL
SPECIFIC GRAVITY, URINE: 1.01 (ref 1.005–1.030)

## 2016-06-14 LAB — CBC WITH DIFFERENTIAL/PLATELET
Basophils Absolute: 0.1 10*3/uL (ref 0.0–0.1)
Basophils Relative: 1 %
EOS PCT: 5 %
Eosinophils Absolute: 0.3 10*3/uL (ref 0.0–0.7)
HEMATOCRIT: 32.7 % — AB (ref 36.0–46.0)
Hemoglobin: 10.3 g/dL — ABNORMAL LOW (ref 12.0–15.0)
LYMPHS ABS: 2.8 10*3/uL (ref 0.7–4.0)
LYMPHS PCT: 46 %
MCH: 24.8 pg — AB (ref 26.0–34.0)
MCHC: 31.5 g/dL (ref 30.0–36.0)
MCV: 78.6 fL (ref 78.0–100.0)
MONO ABS: 0.2 10*3/uL (ref 0.1–1.0)
MONOS PCT: 4 %
Neutro Abs: 2.6 10*3/uL (ref 1.7–7.7)
Neutrophils Relative %: 44 %
PLATELETS: 487 10*3/uL — AB (ref 150–400)
RBC: 4.16 MIL/uL (ref 3.87–5.11)
RDW: 16.1 % — AB (ref 11.5–15.5)
WBC: 5.9 10*3/uL (ref 4.0–10.5)

## 2016-06-14 LAB — WET PREP, GENITAL
Clue Cells Wet Prep HPF POC: NONE SEEN
SPERM: NONE SEEN
TRICH WET PREP: NONE SEEN
YEAST WET PREP: NONE SEEN

## 2016-06-14 LAB — URINE MICROSCOPIC-ADD ON: Bacteria, UA: NONE SEEN

## 2016-06-14 MED ORDER — OXYCODONE-ACETAMINOPHEN 5-325 MG PO TABS
1.0000 | ORAL_TABLET | Freq: Once | ORAL | Status: AC
  Administered 2016-06-14: 1 via ORAL
  Filled 2016-06-14: qty 1

## 2016-06-14 MED ORDER — MORPHINE SULFATE (PF) 4 MG/ML IV SOLN
2.0000 mg | Freq: Once | INTRAVENOUS | Status: AC
  Administered 2016-06-14: 2 mg via INTRAVENOUS
  Filled 2016-06-14: qty 1

## 2016-06-14 MED ORDER — LACTATED RINGERS IV SOLN
INTRAVENOUS | Status: DC
  Administered 2016-06-14: 10:00:00 via INTRAVENOUS

## 2016-06-14 MED ORDER — IBUPROFEN 600 MG PO TABS: 600.0000 mg | ORAL_TABLET | Freq: Four times a day (QID) | ORAL | 1 refills | Status: DC | PRN

## 2016-06-14 MED ORDER — OXYCODONE-ACETAMINOPHEN 5-325 MG PO TABS: 1.0000 | ORAL_TABLET | ORAL | 0 refills | Status: DC | PRN

## 2016-06-14 MED ORDER — METRONIDAZOLE 500 MG PO TABS: 500.0000 mg | ORAL_TABLET | Freq: Two times a day (BID) | ORAL | 0 refills | Status: DC

## 2016-06-14 NOTE — Discharge Instructions (Signed)
[facility], [place], [place]. For followup in a week.

## 2016-06-14 NOTE — MAU Note (Signed)
Having some pain RLQ. [facility] wks ago and told had retained products and appendix was swollen. "Fishy smell down there". Induced for GDM and del 05/20/16 at [facility]. G1P1. States pain is some better RLQ but still there and some pain in L side and urine smells strong. Some light vag bleeding

## 2016-06-14 NOTE — MAU Provider Note (Signed)
Chief Complaint: Abdominal Pain   SUBJECTIVE HPI: Kristin Davila is a 30 y.o. G1P1001 at Unknown who presents to Maternity Admissions reporting abdominal pain, abnormal vaginal discharge.  Patient states she was recently seen in the ED at [facility] on 06/02/16 with vaginal bleeding and abdominal pain. It was determined that she may have had retained placenta on [place], had large clots she was passing per vagina, was given 800 mcg cytotec buccally and sent home with Cipro and Flagyl (2/2 allergy to PCN could not do Augmentin per ED staff). Since she left the ED, she has not had any more vaginal bleeding but has noticed some yellowish-red or orange smelly discharge (describes as fishy odor). She continues to have abdominal pain, that is constant and severe, improved with Percocet (ran out last night). Denies any fevers/chills. Denies N/V but does state she has very little appetite. She is pumping (and dumping 2/2 meds and illness) but not breastfeeding. Denies any diarrhea. Denies any intercourse since prior to birth. Had a normal vaginal delivery at [facility] by [facility] Dr. Senaida Ores.     Past Medical History:  Diagnosis Date  . Anemia    OB History  Gravida Para Term Preterm AB Living  1 1 1     1   SAB TAB Ectopic Multiple Live Births          1    # Outcome Date GA Lbr Len/2nd Weight Sex Delivery Anes PTL Lv  1 Term      Vag-Spont   LIV     Past Surgical History:  Procedure Laterality Date  . NO PAST SURGERIES     Social History   Social History  . Marital status: Single    Spouse name: N/A  . Number of children: N/A  . Years of education: N/A   Occupational History  . Not on file.   Social History Main Topics  . Smoking status: Never Smoker  . Smokeless tobacco: Never Used  . Alcohol use Yes     Comment: ocaqssionally  . Drug use: No  . Sexual activity: Not on file   Other Topics Concern  . Not on file   Social History Narrative  . No narrative on file    No current facility-administered medications on file prior to encounter.    Current Outpatient Prescriptions on File Prior to Encounter  Medication Sig Dispense Refill  . ciprofloxacin (CIPRO) 500 MG tablet Take 1 tablet (500 mg total) by mouth 2 (two) times daily. 14 tablet 0  . ferrous sulfate 325 (65 FE) MG tablet Take 325 mg by mouth 2 (two) times daily with a meal.    . metroNIDAZOLE (FLAGYL) 500 MG tablet Take 1 tablet (500 mg total) by mouth 2 (two) times daily. One po bid x 7 days 14 tablet 0  . oxyCODONE-acetaminophen (PERCOCET) 5-325 MG tablet Take 1 tablet by mouth every 4 (four) hours as needed. 13 tablet 0  . Prenatal Vit-Fe Fumarate-FA (PRENATAL MULTIVITAMIN) TABS tablet Take 1 tablet by mouth daily at 12 noon.     Allergies  Allergen Reactions  . Azo [Phenazopyridine] Anaphylaxis  . Penicillins Hives    I have reviewed the past Medical Hx, Surgical Hx, Social Hx, Allergies and Medications.   REVIEW OF SYSTEMS  A comprehensive ROS was negative except per HPI.     OBJECTIVE Patient Vitals for the past 24 hrs:  BP Temp Pulse Resp Height Weight  06/14/16 0601 126/83 97.8 F (36.6 C) 87 18  5\' 2"  (1.575 m) 121 lb 3.2 oz (55 kg)    PHYSICAL EXAM Constitutional: Well-developed, well-nourished female in no acute distress.  Cardiovascular: normal rate, rhythm, no murmurs. Respiratory: normal rate and effort, CTAB. GI: Abd soft, non-tender, non-distended. Pos BS x 4 MS: Extremities nontender, no edema, normal ROM Neurologic: Alert and oriented x 4.  GU: Neg CVAT. SPECULUM EXAM: NEFG, physiologic lochia discharge, did not notice any odor, old blood noted in vault without active bleeding from cervical os, cervix clean. BIMANUAL: cervix closed; uterus firm, below umbilicus, minimally enlarged, no adnexal tenderness or masses. +CMT. Generalized pelvic pain on palpation  LAB RESULTS Results for orders placed or performed during the hospital encounter of 06/14/16 (from  the past 24 hour(s))  Urinalysis, Routine w reflex microscopic (not at [facility])     Status: Abnormal   Collection Time: 06/14/16  6:10 AM  Result Value Ref Range   Color, Urine YELLOW YELLOW   APPearance CLEAR CLEAR   Specific Gravity, Urine 1.010 1.005 - 1.030   pH 6.0 5.0 - 8.0   Glucose, UA NEGATIVE NEGATIVE mg/dL   Hgb urine dipstick MODERATE (A) NEGATIVE   Bilirubin Urine NEGATIVE NEGATIVE   Ketones, ur NEGATIVE NEGATIVE mg/dL   Protein, ur NEGATIVE NEGATIVE mg/dL   Nitrite NEGATIVE NEGATIVE   Leukocytes, UA MODERATE (A) NEGATIVE  Urine microscopic-add on     Status: Abnormal   Collection Time: 06/14/16  6:10 AM  Result Value Ref Range   Squamous Epithelial / LPF 0-5 (A) NONE SEEN   WBC, UA TOO NUMEROUS TO COUNT 0 - 5 WBC/hpf   RBC / HPF 0-5 0 - 5 RBC/hpf   Bacteria, UA NONE SEEN NONE SEEN  Wet prep, genital     Status: Abnormal   Collection Time: 06/14/16  7:16 AM  Result Value Ref Range   Yeast Wet Prep HPF POC NONE SEEN NONE SEEN   Trich, Wet Prep NONE SEEN NONE SEEN   Clue Cells Wet Prep HPF POC NONE SEEN NONE SEEN   WBC, Wet Prep HPF POC FEW (A) NONE SEEN   Sperm NONE SEEN   CBC with Differential/Platelet     Status: Abnormal (Preliminary result)   Collection Time: 06/14/16  7:39 AM  Result Value Ref Range   WBC 5.9 4.0 - 10.5 K/uL   RBC 4.16 3.87 - 5.11 MIL/uL   Hemoglobin 10.3 (L) 12.0 - 15.0 g/dL   HCT 08.632.7 (L) 57.836.0 - 46.946.0 %   MCV 78.6 78.0 - 100.0 fL   MCH 24.8 (L) 26.0 - 34.0 pg   MCHC 31.5 30.0 - 36.0 g/dL   RDW 62.916.1 (H) 52.811.5 - 41.315.5 %   Platelets 487 (H) 150 - 400 K/uL   Neutrophils Relative % 44 %   Neutro Abs 2.6 1.7 - 7.7 K/uL   Lymphocytes Relative 46 %   Lymphs Abs 2.8 0.7 - 4.0 K/uL   Monocytes Relative 4 %   Monocytes Absolute 0.2 0.1 - 1.0 K/uL   Eosinophils Relative 5 %   Eosinophils Absolute 0.3 0.0 - 0.7 K/uL   Basophils Relative 1 %   Basophils Absolute 0.1 0.0 - 0.1 K/uL   Other PENDING %    IMAGING [place] Transvaginal Non-ob  Result  Date: 06/02/2016 CLINICAL DATA:  Two weeks postpartum. Bleeding initially stopped after delivery and in re- occurred heavily 2 days ago. EXAM: TRANSABDOMINAL AND TRANSVAGINAL ULTRASOUND OF PELVIS DOPPLER ULTRASOUND OF OVARIES TECHNIQUE: Both transabdominal and transvaginal ultrasound examinations of the pelvis were  performed. Transabdominal technique was performed for global imaging of the pelvis including uterus, ovaries, adnexal regions, and pelvic cul-de-sac. It was necessary to proceed with endovaginal exam following the transabdominal exam to visualize the uterus, endometrium, ovaries, and adnexal regions. Color and duplex Doppler ultrasound was utilized to evaluate blood flow to the ovaries. COMPARISON:  CT scan of the abdomen and pelvis dated June 02, 2016 FINDINGS: Uterus Measurements: 13.8 x 7.4 x 7.7 cm. The myometrial echotexture is normal. Endometrium Thickness: 12.8 cm in thickness in the fundus and 8.4 mm in the body. There is mixed echogenicity material present within the endometrial cavity with without increased vascularity Right ovary Measurements: 3.6 x 3.0 x 1.8 cm. Normal appearance/no adnexal mass. Left ovary Measurements: 4.9 x 1.3 x 2.1 cm. Normal appearance/no adnexal mass. Pulsed Doppler evaluation of both ovaries demonstrates normal low-resistance arterial and venous waveforms. Other findings No abnormal free fluid. IMPRESSION: Probable retained products of conception. Appropriate size of the uterus for the postpartum state. Normal appearance of the ovaries and adnexal regions. Electronically Signed   By: David  SwazilandJordan M.D.   On: 06/02/2016 07:06   [place] Pelvis Complete  Result Date: 06/02/2016 CLINICAL DATA:  Two weeks postpartum. Bleeding initially stopped after delivery and in re- occurred heavily 2 days ago. EXAM: TRANSABDOMINAL AND TRANSVAGINAL ULTRASOUND OF PELVIS DOPPLER ULTRASOUND OF OVARIES TECHNIQUE: Both transabdominal and transvaginal ultrasound examinations of the pelvis  were performed. Transabdominal technique was performed for global imaging of the pelvis including uterus, ovaries, adnexal regions, and pelvic cul-de-sac. It was necessary to proceed with endovaginal exam following the transabdominal exam to visualize the uterus, endometrium, ovaries, and adnexal regions. Color and duplex Doppler ultrasound was utilized to evaluate blood flow to the ovaries. COMPARISON:  CT scan of the abdomen and pelvis dated June 02, 2016 FINDINGS: Uterus Measurements: 13.8 x 7.4 x 7.7 cm. The myometrial echotexture is normal. Endometrium Thickness: 12.8 cm in thickness in the fundus and 8.4 mm in the body. There is mixed echogenicity material present within the endometrial cavity with without increased vascularity Right ovary Measurements: 3.6 x 3.0 x 1.8 cm. Normal appearance/no adnexal mass. Left ovary Measurements: 4.9 x 1.3 x 2.1 cm. Normal appearance/no adnexal mass. Pulsed Doppler evaluation of both ovaries demonstrates normal low-resistance arterial and venous waveforms. Other findings No abnormal free fluid. IMPRESSION: Probable retained products of conception. Appropriate size of the uterus for the postpartum state. Normal appearance of the ovaries and adnexal regions. Electronically Signed   By: David  [place]Jordan M.D.   On: 06/02/2016 07:06   Ct Abdomen Pelvis W Contrast  Result Date: 06/02/2016 CLINICAL DATA:  30 y/o F; right lower quadrant abdominal pain, vaginal bleeding, 2 weeks postpartum vaginal delivery. EXAM: CT ABDOMEN AND PELVIS WITH CONTRAST TECHNIQUE: Multidetector CT imaging of the abdomen and pelvis was performed using the standard protocol following bolus administration of intravenous contrast. CONTRAST:  1 ISOVUE-300 IOPAMIDOL (ISOVUE-300) INJECTION 61% COMPARISON:  12/24/2011 CT abdomen and pelvis. FINDINGS: Lower chest: No acute abnormality. Hepatobiliary: No focal liver lesion. Dependent high attenuation within the gallbladder likely represents vicarious contrast  excretion. No intra or extrahepatic biliary ductal dilatation. Pancreas: Unremarkable. No pancreatic ductal dilatation or surrounding inflammatory changes. Spleen: Normal in size without focal abnormality. Adrenals/Urinary Tract: Right kidney striated nephrogram. Normal adrenal glands. No obstructive uropathy or urinary stone disease. Normal bladder. Stomach/Bowel: Stomach is within normal limits. Right appendix is borderline in size measuring 7 mm containing high attenuation material possibly representing vicarious contrast excretion. The appendix has increased  in size in comparison with prior CT of the abdomen and pelvis. No definite secondary signs of acute appendicitis. Otherwise no evidence of bowel wall thickening, distention, or inflammatory changes. Vascular/Lymphatic: No significant vascular findings are present. No enlarged abdominal or pelvic lymph nodes. Reproductive: Uterus is markedly enlarged with heterogeneous myometrium and punctate focus of gas in the endometrial canal which are findings postpartum for several weeks. Poor venous opacification to assess for venous thrombosis. Small cluster of vessels at the dorsal margin of the endometrial cavity (series 303, image 62). Other: No abdominal wall hernia or abnormality. No abdominopelvic ascites. Musculoskeletal: No acute or significant osseous findings. IMPRESSION: 1. Borderline appendix size, increased in comparison with prior CT, without secondary signs of acute appendicitis is indeterminate. 2. Mild striated right kidney nephrogram can be seen in the setting of pyelonephritis, tubular necrosis, vascular injury or sequelae obstruction. No hydronephrosis at this time. 3. Postpartum appearance of the uterus. Small cluster vessels of the dorsal margin of the endometrial cavity in the setting of vaginal bleeding raises question retained products or vascular malformation. Pelvic ultrasound is recommended. Electronically Signed   By: Mitzi Hansen M.D.   On: 06/02/2016 05:36   [place] Art/ven Flow Abd Pelv Doppler  Result Date: 06/02/2016 CLINICAL DATA:  Two weeks postpartum. Bleeding initially stopped after delivery and in re- occurred heavily 2 days ago. EXAM: TRANSABDOMINAL AND TRANSVAGINAL ULTRASOUND OF PELVIS DOPPLER ULTRASOUND OF OVARIES TECHNIQUE: Both transabdominal and transvaginal ultrasound examinations of the pelvis were performed. Transabdominal technique was performed for global imaging of the pelvis including uterus, ovaries, adnexal regions, and pelvic cul-de-sac. It was necessary to proceed with endovaginal exam following the transabdominal exam to visualize the uterus, endometrium, ovaries, and adnexal regions. Color and duplex Doppler ultrasound was utilized to evaluate blood flow to the ovaries. COMPARISON:  CT scan of the abdomen and pelvis dated June 02, 2016 FINDINGS: Uterus Measurements: 13.8 x 7.4 x 7.7 cm. The myometrial echotexture is normal. Endometrium Thickness: 12.8 cm in thickness in the fundus and 8.4 mm in the body. There is mixed echogenicity material present within the endometrial cavity with without increased vascularity Right ovary Measurements: 3.6 x 3.0 x 1.8 cm. Normal appearance/no adnexal mass. Left ovary Measurements: 4.9 x 1.3 x 2.1 cm. Normal appearance/no adnexal mass. Pulsed Doppler evaluation of both ovaries demonstrates normal low-resistance arterial and venous waveforms. Other findings No abnormal free fluid. IMPRESSION: Probable retained products of conception. Appropriate size of the uterus for the postpartum state. Normal appearance of the ovaries and adnexal regions. Electronically Signed   By: David  [place] M.D.   On: 06/02/2016 07:06    MAU COURSE SSE - normal lochia, +CMT/pelvic pain CBC- WNL, no sign of infection or worsening anemia Culture of fluid from vagina GC/CT - pending Wet Prep -NEG UA/UCx (+pyuria but no bacturia... Has been on abx-Cipro) Percocet  Dr.  Emelda Fear notified of patient & will come assess her & discuss plan of care.     Cleda Clarks, DO  [facility]

## 2016-06-15 LAB — URINE CULTURE: Culture: NO GROWTH

## 2016-06-16 LAB — AEROBIC CULTURE W GRAM STAIN (SUPERFICIAL SPECIMEN): Culture: NO GROWTH

## 2016-06-18 LAB — GC/CHLAMYDIA PROBE AMP (~~LOC~~) NOT AT ARMC
Chlamydia: NEGATIVE
NEISSERIA GONORRHEA: NEGATIVE

## 2016-09-01 DIAGNOSIS — Z79899 Other long term (current) drug therapy: Secondary | ICD-10-CM | POA: Diagnosis not present

## 2016-09-01 DIAGNOSIS — N611 Abscess of the breast and nipple: Secondary | ICD-10-CM | POA: Diagnosis present

## 2016-09-02 ENCOUNTER — Emergency Department (HOSPITAL_COMMUNITY)
Admission: EM | Admit: 2016-09-02 | Discharge: 2016-09-02 | Disposition: A | Discharge status: Home / Self Care | Payer: Medicaid Other | Attending: Emergency Medicine | Admitting: Emergency Medicine

## 2016-09-02 ENCOUNTER — Encounter (HOSPITAL_COMMUNITY): Payer: Self-pay | Admitting: Emergency Medicine

## 2016-09-02 DIAGNOSIS — N611 Abscess of the breast and nipple: Secondary | ICD-10-CM

## 2016-09-02 MED ORDER — CLINDAMYCIN HCL 300 MG PO CAPS
300.0000 mg | ORAL_CAPSULE | Freq: Once | ORAL | Status: AC
  Administered 2016-09-02: 300 mg via ORAL
  Filled 2016-09-02: qty 1

## 2016-09-02 MED ORDER — CLINDAMYCIN HCL 150 MG PO CAPS: 300.0000 mg | ORAL_CAPSULE | Freq: Four times a day (QID) | ORAL | 0 refills | Status: DC

## 2016-09-02 MED ORDER — OXYCODONE-ACETAMINOPHEN 5-325 MG PO TABS
1.0000 | ORAL_TABLET | Freq: Once | ORAL | Status: AC
  Administered 2016-09-02: 1 via ORAL
  Filled 2016-09-02: qty 1

## 2016-09-02 MED ORDER — HYDROCODONE-ACETAMINOPHEN 5-325 MG PO TABS: 1.0000 | ORAL_TABLET | ORAL | 0 refills | Status: DC | PRN

## 2016-09-02 NOTE — ED Triage Notes (Signed)
Pt states she has had a sore red area around her left nipple. Pt states she was breastfeeding her daughter but she stopped in Nov. 3 days ago she began having pain. Pt states she was told to try to use her breast pump to see if she had a clot. Pt states when she did she got blood and pus out of her nipple. Pt is not febrile nor tachycardic

## 2016-09-02 NOTE — ED Notes (Signed)
MD at bedside. 

## 2016-09-02 NOTE — ED Provider Notes (Signed)
[facility]-EMERGENCY DEPT Provider Note   CSN: [id number] Arrival date & time: 09/01/16  2352  By signing my name below, I, Elder NegusRussell Johnston, attest that this documentation has been prepared under the direction and in the presence of Gilda Creasehristopher J Kameisha Malicki, MD. Electronically Signed: Elder Negusussell Johnston, Scribe. 09/02/16. 1:50 AM.   History   Chief Complaint Chief Complaint  Patient presents with  . Abscess    HPI Kristin Davila is a 31 y.o. female who presents to the ED for evaluation of a possible abscess. This patient states that she was previously breast feeding her child which stopped 3 months ago. Two days ago she first noticed an area of redness around her L nipple with severe associated pain. Worse on palpation. Several hours ago she believes that she did express a small amount of pirulent discharge. She denies any fevers. She is taking ibuprofen without improvement.   The history is provided by the patient. No language interpreter was used.    Past Medical History:  Diagnosis Date  . Anemia     There are no active problems to display for this patient.   Past Surgical History:  Procedure Laterality Date  . NO PAST SURGERIES      OB History    Gravida Para Term Preterm AB Living   1 1 1     1    SAB TAB Ectopic Multiple Live Births           1       Home Medications    Prior to Admission medications   Medication Sig Start Date End Date Taking? Authorizing Provider  ibuprofen (ADVIL,MOTRIN) 200 MG tablet Take 800 mg by mouth every 6 (six) hours as needed.   Yes Historical Provider, MD  oxymetazoline (AFRIN) 0.05 % nasal spray Place 1 spray into both nostrils 3 (three) times daily as needed for congestion.   Yes Historical Provider, MD  ciprofloxacin (CIPRO) 500 MG tablet Take 1 tablet (500 mg total) by mouth 2 (two) times daily. Patient not taking: Reported on 09/02/2016 06/02/16   Joni ReiningNicole Pisciotta, PA-C  ibuprofen (ADVIL,MOTRIN) 600 MG tablet Take 1 tablet (600  mg total) by mouth every 6 (six) hours as needed. Patient not taking: Reported on 09/02/2016 06/14/16   Tilda BurrowJohn Ferguson V, MD  metroNIDAZOLE (FLAGYL) 500 MG tablet Take 1 tablet (500 mg total) by mouth 2 (two) times daily. One po bid x 7 days Patient not taking: Reported on 09/02/2016 06/14/16   Tilda BurrowJohn Ferguson V, MD  oxyCODONE-acetaminophen (PERCOCET) 5-325 MG tablet Take 1 tablet by mouth every 4 (four) hours as needed. Patient not taking: Reported on 09/02/2016 06/14/16   Tilda BurrowJohn Ferguson V, MD    Family History No family history on file.  Social History Social History  Substance Use Topics  . Smoking status: Never Smoker  . Smokeless tobacco: Never Used  . Alcohol use Yes     Comment: ocaqssionally     Allergies   Azo [phenazopyridine] and Penicillins   Review of Systems Review of Systems  Constitutional: Negative for fever.  Skin:       Breast problem per HPI  All other systems reviewed and are negative.    Physical Exam Updated Vital Signs BP 112/73 (BP Location: Right Arm)   Pulse 94   Temp 97.7 F (36.5 C) (Oral)   Resp 16   Ht 5\' 2"  (1.575 m)   Wt 120 lb (54.4 kg)   SpO2 99%   BMI 21.95 kg/m   Physical  Exam  Constitutional: She is oriented to person, place, and time. She appears well-developed and well-nourished. No distress.  HENT:  Head: Normocephalic and atraumatic.  Right Ear: Hearing normal.  Left Ear: Hearing normal.  Nose: Nose normal.  Mouth/Throat: Oropharynx is clear and moist and mucous membranes are normal.  Eyes: Conjunctivae and EOM are normal. Pupils are equal, round, and reactive to light.  Neck: Normal range of motion. Neck supple.  Cardiovascular: Regular rhythm, S1 normal and S2 normal.  Exam reveals no gallop and no friction rub.   No murmur heard. Pulmonary/Chest: Effort normal and breath sounds normal. No respiratory distress. She exhibits no tenderness.  Abdominal: Soft. Normal appearance and bowel sounds are normal. There is no  hepatosplenomegaly. There is no tenderness. There is no rebound, no guarding, no tenderness at McBurney's point and negative Murphy's sign. No hernia.  Musculoskeletal: Normal range of motion.  Neurological: She is alert and oriented to person, place, and time. She has normal strength. No cranial nerve deficit or sensory deficit. Coordination normal. GCS eye subscore is 4. GCS verbal subscore is 5. GCS motor subscore is 6.  Skin: Skin is warm, dry and intact. No rash noted. No cyanosis.  There is a tender erythematous region to the medial aureolus of the L breast without fluctuance.   Psychiatric: She has a normal mood and affect. Her speech is normal and behavior is normal. Thought content normal.  Nursing note and vitals reviewed.    ED Treatments / Results  DIAGNOSTIC STUDIES: Oxygen Saturation is 100 percent on room air which is normal by my interpretation.    COORDINATION OF CARE: 1:49 AM Discussed treatment plan with pt at bedside and pt agreed to plan.  Labs (all labs ordered are listed, but only abnormal results are displayed) Labs Reviewed - No data to display  EKG  EKG Interpretation None       Radiology No results found.  Procedures Procedures (including critical care time)  Medications Ordered in ED Medications  oxyCODONE-acetaminophen (PERCOCET/ROXICET) 5-325 MG per tablet 1 tablet (not administered)  clindamycin (CLEOCIN) capsule 300 mg (not administered)     Initial Impression / Assessment and Plan / ED Course  I have reviewed the triage vital signs and the nursing notes.  Pertinent labs & imaging results that were available during my care of the patient were reviewed by me and considered in my medical decision making (see chart for details).     Patient presents to the emergency part for evaluation of painful swollen area of the left breast. There is overlying erythema and tenderness without fluctuance. Quick view with ultrasound reveals no evidence of  underlying fluid collection or pus that can be drained. It sounds like she did have some drainage earlier, all drainable pus has been spontaneously evacuated and does not require I&D at this time. Will initiate antibiotic treatment, follow-up with general surgery for further breast evaluation. Return if worsen.  Final Clinical Impressions(s) / ED Diagnoses   Final diagnoses:  Breast abscess    New Prescriptions New Prescriptions   No medications on file     Gilda Crease, MD 09/02/16 [phone]

## 2016-10-02 ENCOUNTER — Emergency Department (HOSPITAL_COMMUNITY)
Admission: EM | Admit: 2016-10-02 | Discharge: 2016-10-02 | Discharge status: Against Medical Advice | Payer: Medicaid Other | Attending: Emergency Medicine | Admitting: Emergency Medicine

## 2016-10-02 ENCOUNTER — Encounter (HOSPITAL_COMMUNITY): Payer: Self-pay | Admitting: Emergency Medicine

## 2016-10-02 DIAGNOSIS — Z79899 Other long term (current) drug therapy: Secondary | ICD-10-CM | POA: Insufficient documentation

## 2016-10-02 DIAGNOSIS — F1120 Opioid dependence, uncomplicated: Secondary | ICD-10-CM | POA: Diagnosis not present

## 2016-10-02 DIAGNOSIS — F1123 Opioid dependence with withdrawal: Secondary | ICD-10-CM

## 2016-10-02 DIAGNOSIS — F1193 Opioid use, unspecified with withdrawal: Secondary | ICD-10-CM

## 2016-10-02 DIAGNOSIS — R102 Pelvic and perineal pain: Secondary | ICD-10-CM | POA: Diagnosis present

## 2016-10-02 LAB — CBC
HEMATOCRIT: 39.8 % (ref 36.0–46.0)
HEMOGLOBIN: 13.1 g/dL (ref 12.0–15.0)
MCH: 27.7 pg (ref 26.0–34.0)
MCHC: 32.9 g/dL (ref 30.0–36.0)
MCV: 84.1 fL (ref 78.0–100.0)
Platelets: 312 10*3/uL (ref 150–400)
RBC: 4.73 MIL/uL (ref 3.87–5.11)
RDW: 13.6 % (ref 11.5–15.5)
WBC: 7.3 10*3/uL (ref 4.0–10.5)

## 2016-10-02 LAB — COMPREHENSIVE METABOLIC PANEL
ALT: 15 U/L (ref 14–54)
AST: 24 U/L (ref 15–41)
Albumin: 4.7 g/dL (ref 3.5–5.0)
Alkaline Phosphatase: 67 U/L (ref 38–126)
Anion gap: 13 (ref 5–15)
BILIRUBIN TOTAL: 0.8 mg/dL (ref 0.3–1.2)
BUN: 12 mg/dL (ref 6–20)
CHLORIDE: 102 mmol/L (ref 101–111)
CO2: 22 mmol/L (ref 22–32)
Calcium: 9.9 mg/dL (ref 8.9–10.3)
Creatinine, Ser: 0.83 mg/dL (ref 0.44–1.00)
Glucose, Bld: 99 mg/dL (ref 65–99)
POTASSIUM: 4.2 mmol/L (ref 3.5–5.1)
Sodium: 137 mmol/L (ref 135–145)
Total Protein: 8.7 g/dL — ABNORMAL HIGH (ref 6.5–8.1)

## 2016-10-02 LAB — RAPID URINE DRUG SCREEN, HOSP PERFORMED
Amphetamines: NOT DETECTED
Barbiturates: NOT DETECTED
Benzodiazepines: NOT DETECTED
COCAINE: NOT DETECTED
OPIATES: POSITIVE — AB
TETRAHYDROCANNABINOL: NOT DETECTED

## 2016-10-02 LAB — ETHANOL

## 2016-10-02 MED ORDER — CLONIDINE HCL 0.1 MG PO TABS
0.1000 mg | ORAL_TABLET | Freq: Once | ORAL | Status: AC
  Administered 2016-10-02: 0.1 mg via ORAL
  Filled 2016-10-02: qty 1

## 2016-10-02 MED ORDER — HYDROXYZINE HCL 25 MG PO TABS
25.0000 mg | ORAL_TABLET | Freq: Once | ORAL | Status: AC
  Administered 2016-10-02: 25 mg via ORAL
  Filled 2016-10-02: qty 1

## 2016-10-02 MED ORDER — ONDANSETRON 4 MG PO TBDP
4.0000 mg | ORAL_TABLET | Freq: Once | ORAL | Status: AC
  Administered 2016-10-02: 4 mg via ORAL
  Filled 2016-10-02: qty 1

## 2016-10-02 NOTE — ED Notes (Signed)
See EDP assessment 

## 2016-10-02 NOTE — ED Provider Notes (Signed)
[facility]-EMERGENCY DEPT Provider Note   By signing my name below, I, Earmon Phoenix, attest that this documentation has been prepared under the direction and in the presence of [facility], [place]. Electronically Signed: Earmon Phoenix, ED Scribe. 10/02/16. 10:15 PM.    History   Chief Complaint Chief Complaint  Patient presents with  . Pelvic Pain    requesting detox    The history is provided by the patient and medical records. No language interpreter was used.    Kristin Davila is a 31 y.o. female who presents to the Emergency Department complaining of pelvic pain that began five months ago after giving birth. She states she had a piece of retained placenta that eventually passed and has been evaluated for this in the past. She reports associated thick, yellow discharge. She has not taken anything for pain relief. She denies modifying factors. She denies fever, vomiting, vaginal bleeding, dysuria, frequency or urgency. She reports being sexually active with one partner and does not use protection or birth control.   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 She reports chills, nausea, diarrhea and tremors. She states she last used heroin about 18 hours ago. States at this point she doesn't care about the pelvic pain only that she is withdrawing.   Past Medical History:  Diagnosis Date  . Anemia     There are no active problems to display for this patient.   Past Surgical History:  Procedure Laterality Date  . NO PAST SURGERIES      OB History    Gravida Para Term Preterm AB Living   1 1 1     1    SAB TAB Ectopic Multiple Live Births           1       Home Medications    Prior to Admission medications   Medication Sig Start Date End Date Taking? Authorizing Provider  ciprofloxacin (CIPRO) 500 MG tablet Take 1 tablet (500 mg total) by mouth 2 (two) times daily. Patient not taking: Reported on 09/02/2016 06/02/16   Joni Reining Pisciotta, PA-C  clindamycin  (CLEOCIN) 150 MG capsule Take 2 capsules (300 mg total) by mouth 4 (four) times daily. 09/02/16   Gilda Crease, MD  HYDROcodone-acetaminophen (NORCO/VICODIN) 5-325 MG tablet Take 1-2 tablets by mouth every 4 (four) hours as needed for moderate pain. 09/02/16   Gilda Crease, MD  ibuprofen (ADVIL,MOTRIN) 200 MG tablet Take 800 mg by mouth every 6 (six) hours as needed.    Historical Provider, MD  ibuprofen (ADVIL,MOTRIN) 600 MG tablet Take 1 tablet (600 mg total) by mouth every 6 (six) hours as needed. Patient not taking: Reported on 09/02/2016 06/14/16   Tilda Burrow, MD  metroNIDAZOLE (FLAGYL) 500 MG tablet Take 1 tablet (500 mg total) by mouth 2 (two) times daily. One po bid x 7 days Patient not taking: Reported on 09/02/2016 06/14/16   Tilda Burrow, MD  oxyCODONE-acetaminophen (PERCOCET) 5-325 MG tablet Take 1 tablet by mouth every 4 (four) hours as needed. Patient not taking: Reported on 09/02/2016 06/14/16   Tilda Burrow, MD  oxymetazoline (AFRIN) 0.05 % nasal spray Place 1 spray into both nostrils 3 (three) times daily as needed for congestion.    Historical Provider, MD    Family History History reviewed. No pertinent family history.  Social History Social History  Substance Use Topics  . Smoking status: Never Smoker  . Smokeless tobacco: Never Used  . Alcohol use Yes  Comment: ocaqssionally     Allergies   Azo [phenazopyridine] and Penicillins   Review of Systems Review of Systems  Constitutional: Positive for chills. Negative for fever.  Gastrointestinal: Positive for abdominal pain, diarrhea and nausea. Negative for vomiting.  Genitourinary: Positive for pelvic pain. Negative for dysuria, frequency and urgency.  Neurological: Positive for tremors.  Psychiatric/Behavioral: The patient is nervous/anxious.      Physical Exam Updated Vital Signs BP (!) 116/53 (BP Location: Right Arm)   Pulse (!) 103   Temp 97.9 F (36.6 C) (Oral)   Resp 16    LMP 09/15/2016   SpO2 98%   Physical Exam  Constitutional: She is oriented to person, place, and time. She appears well-developed and well-nourished.  HENT:  Head: Normocephalic.  Eyes: EOM are normal.  Neck: Neck supple.  Cardiovascular: Tachycardia present.   Pulmonary/Chest: Effort normal.  Abdominal: Soft. There is tenderness. There is no CVA tenderness.  Tender on palpation of lower abdomen. No guarding or rebound  Genitourinary:  Genitourinary Comments: Patient declined pelvic exam  Musculoskeletal: Normal range of motion.  Neurological: She is alert and oriented to person, place, and time. No cranial nerve deficit.  Skin: Skin is warm and dry.  Psychiatric: Her mood appears anxious. She is agitated. She expresses no homicidal and no suicidal ideation.  Nursing note and vitals reviewed.    ED Treatments / Results  DIAGNOSTIC STUDIES: Oxygen Saturation is 98% on RA, normal by my interpretation.   COORDINATION OF CARE: 9:42 PM- Will perform pelvic exam and order ultrasound. Will order Clonidine for heroin withdrawal. Pt verbalizes understanding and agrees to plan.  Medications  cloNIDine (CATAPRES) tablet 0.1 mg (0.1 mg Oral Given 10/02/16 2202)  ondansetron (ZOFRAN-ODT) disintegrating tablet 4 mg (4 mg Oral Given 10/02/16 2202)  hydrOXYzine (ATARAX/VISTARIL) tablet 25 mg (25 mg Oral Given 10/02/16 2202)    Labs (all labs ordered are listed, but only abnormal results are displayed) Labs Reviewed  COMPREHENSIVE METABOLIC PANEL - Abnormal; Notable for the following:       Result Value   Total Protein 8.7 (*)    All other components within normal limits  RAPID URINE DRUG SCREEN, HOSP PERFORMED - Abnormal; Notable for the following:    Opiates POSITIVE (*)    All other components within normal limits  ETHANOL  CBC    Radiology No results found.  Procedures Procedures (including critical care time)  Medications Ordered in ED Medications  cloNIDine (CATAPRES)  tablet 0.1 mg (0.1 mg Oral Given 10/02/16 2202)  ondansetron (ZOFRAN-ODT) disintegrating tablet 4 mg (4 mg Oral Given 10/02/16 2202)  hydrOXYzine (ATARAX/VISTARIL) tablet 25 mg (25 mg Oral Given 10/02/16 2202)     Initial Impression / Assessment and Plan / ED Course  I have reviewed the triage vital signs and the nursing notes.I personally performed the services described in this documentation, which was scribed in my presence. The recorded information has been reviewed and is accurate.   Patient came out of exam room with her mother and states that she does not want any further care and she is leaving.   Final Clinical Impressions(s) / ED Diagnoses  Pelvic pain Heron withdrawal  New Prescriptions Discharge Medication List as of 10/02/2016 10:33 PM       [facility] Orlene OchM Neese, NP 10/03/16 0235    Dione Boozeavid Glick, MD 10/04/16 [phone]

## 2016-10-02 NOTE — ED Notes (Signed)
Pt called for in waiting area for triage x2. No answer

## 2016-10-02 NOTE — ED Notes (Signed)
Pt states wanting to leave. Explained pelvic assessment has not been done but pt states she does not want to do that. MD aware. Pt leaving AMA.

## 2016-10-02 NOTE — ED Triage Notes (Signed)
Pt here requesting detox from heroine, states she is having 6/10 pelvic pain. States last use of heroine this morning, very anxious on triage, denies any suicidal ideation.

## 2016-10-14 ENCOUNTER — Emergency Department: Admit: 2016-10-14 | Payer: MEDICAID

## 2016-10-14 ENCOUNTER — Inpatient Hospital Stay
Admit: 2016-10-14 | Discharge: 2016-10-14 | Disposition: A | Discharge status: Home / Self Care | Payer: MEDICAID | Attending: Emergency Medicine

## 2016-10-14 DIAGNOSIS — R1031 Right lower quadrant pain: Secondary | ICD-10-CM

## 2016-10-14 LAB — METABOLIC PANEL, COMPREHENSIVE
A-G Ratio: 1 — ABNORMAL LOW (ref 1.1–2.2)
ALT (SGPT): 13 U/L (ref 12–78)
AST (SGOT): 13 U/L — ABNORMAL LOW (ref 15–37)
Albumin: 4 g/dL (ref 3.5–5.0)
Alk. phosphatase: 70 U/L (ref 45–117)
Anion gap: 7 mmol/L (ref 5–15)
BUN/Creatinine ratio: 12 (ref 12–20)
BUN: 9 MG/DL (ref 6–20)
Bilirubin, total: 0.4 MG/DL (ref 0.2–1.0)
CO2: 27 mmol/L (ref 21–32)
Calcium: 9 MG/DL (ref 8.5–10.1)
Chloride: 106 mmol/L (ref 97–108)
Creatinine: 0.73 MG/DL (ref 0.55–1.02)
GFR est AA: >60 mL/min/{1.73_m2} (ref >60)
GFR est non-AA: >60 mL/min/{1.73_m2} (ref >60)
Globulin: 4.1 g/dL — ABNORMAL HIGH (ref 2.0–4.0)
Glucose: 93 mg/dL (ref 65–100)
Potassium: 3.9 mmol/L (ref 3.5–5.1)
Protein, total: 8.1 g/dL (ref 6.4–8.2)
Sodium: 140 mmol/L (ref 136–145)

## 2016-10-14 LAB — URINALYSIS W/MICROSCOPIC
Bilirubin: NEGATIVE
Blood: NEGATIVE
Glucose: NEGATIVE mg/dL
Ketone: NEGATIVE mg/dL
Leukocyte Esterase: NEGATIVE
Nitrites: NEGATIVE
Protein: NEGATIVE mg/dL
Specific gravity: 1.027 (ref 1.003–1.030)
Urobilinogen: 0.2 EU/dL (ref 0.2–1.0)
pH (UA): 6.5 (ref 5.0–8.0)

## 2016-10-14 LAB — CBC WITH AUTOMATED DIFF
ABS. BASOPHILS: 0 10*3/uL (ref 0.0–0.1)
ABS. EOSINOPHILS: 0 10*3/uL (ref 0.0–0.4)
ABS. IMM. GRANS.: 0 10*3/uL (ref 0.00–0.04)
ABS. LYMPHOCYTES: 1.8 10*3/uL (ref 0.8–3.5)
ABS. MONOCYTES: 0.4 10*3/uL (ref 0.0–1.0)
ABS. NEUTROPHILS: 4.5 10*3/uL (ref 1.8–8.0)
ABSOLUTE NRBC: 0 10*3/uL (ref 0.00–0.01)
BASOPHILS: 1 % (ref 0–1)
EOSINOPHILS: 1 % (ref 0–7)
HCT: 36.1 % (ref 35.0–47.0)
HGB: 11.8 g/dL (ref 11.5–16.0)
IMMATURE GRANULOCYTES: 0 % (ref 0.0–0.5)
LYMPHOCYTES: 27 % (ref 12–49)
MCH: 29.2 PG (ref 26.0–34.0)
MCHC: 32.7 g/dL (ref 30.0–36.5)
MCV: 89.4 FL (ref 80.0–99.0)
MONOCYTES: 6 % (ref 5–13)
MPV: 10.6 FL (ref 8.9–12.9)
NEUTROPHILS: 67 % (ref 32–75)
NRBC: 0 PER 100 WBC
PLATELET: 352 10*3/uL (ref 150–400)
RBC: 4.04 M/uL (ref 3.80–5.20)
RDW: 13.4 % (ref 11.5–14.5)
WBC: 6.7 10*3/uL (ref 3.6–11.0)

## 2016-10-14 LAB — URINE CULTURE HOLD SAMPLE

## 2016-10-14 LAB — KOH, OTHER SOURCES: KOH: NONE SEEN

## 2016-10-14 LAB — WET PREP
Clue cells: ABSENT
Wet prep: NONE SEEN

## 2016-10-14 LAB — HCG URINE, QL. - POC: Pregnancy test,urine (POC): NEGATIVE

## 2016-10-14 MED ORDER — ACETAMINOPHEN 325 MG TABLET
325 mg | ORAL | Status: AC
Start: 2016-10-14 — End: 2016-10-14
  Administered 2016-10-14: 20:00:00 via ORAL

## 2016-10-14 MED ORDER — NAPROXEN 500 MG TAB
500 mg | ORAL_TABLET | Freq: Two times a day (BID) | ORAL | 0 refills | Status: AC
Start: 2016-10-14 — End: ?

## 2016-10-14 MED ORDER — SODIUM CHLORIDE 0.9 % IV
Freq: Once | INTRAVENOUS | Status: AC
Start: 2016-10-14 — End: 2016-10-14
  Administered 2016-10-14: 20:00:00 via INTRAVENOUS

## 2016-10-14 MED ORDER — SODIUM CHLORIDE 0.9 % IJ SYRG
Freq: Once | INTRAMUSCULAR | Status: AC
Start: 2016-10-14 — End: 2016-10-14
  Administered 2016-10-14: 20:00:00 via INTRAVENOUS

## 2016-10-14 MED ORDER — SODIUM CHLORIDE 0.9% BOLUS IV
0.9 % | Freq: Once | INTRAVENOUS | Status: AC
Start: 2016-10-14 — End: 2016-10-14
  Administered 2016-10-14: 20:00:00 via INTRAVENOUS

## 2016-10-14 MED ORDER — IOPAMIDOL 76 % IV SOLN
370 mg iodine /mL (76 %) | Freq: Once | INTRAVENOUS | Status: AC
Start: 2016-10-14 — End: 2016-10-14
  Administered 2016-10-14: 20:00:00 via INTRAVENOUS

## 2016-10-14 MED ORDER — ONDANSETRON (PF) 4 MG/2 ML INJECTION
4 mg/2 mL | Freq: Once | INTRAMUSCULAR | Status: AC
Start: 2016-10-14 — End: 2016-10-14
  Administered 2016-10-14: 20:00:00 via INTRAVENOUS

## 2016-10-14 MED FILL — SODIUM CHLORIDE 0.9 % IV: INTRAVENOUS | Qty: 1000

## 2016-10-14 MED FILL — SODIUM CHLORIDE 0.9 % IV: INTRAVENOUS | Qty: 100

## 2016-10-14 MED FILL — ACETAMINOPHEN 325 MG TABLET: 325 mg | ORAL | Qty: 3

## 2016-10-14 MED FILL — ISOVUE-370  76 % INTRAVENOUS SOLUTION: 370 mg iodine /mL (76 %) | INTRAVENOUS | Qty: 100

## 2016-10-14 MED FILL — NORMAL SALINE FLUSH 0.9 % INJECTION SYRINGE: INTRAMUSCULAR | Qty: 10

## 2016-10-14 MED FILL — ONDANSETRON (PF) 4 MG/2 ML INJECTION: 4 mg/2 mL | INTRAMUSCULAR | Qty: 2

## 2016-10-14 NOTE — ED Triage Notes (Signed)
C/o Right mid abdominal pain and nausea since this morning. Denies diarrhea. Denies fevers.   LBM last night. Recent pregnancy.

## 2016-10-14 NOTE — ED Provider Notes (Signed)
HPI Comments: 31 year old female presents emergency room for evaluation of right-sided abdominal pain. Pain began this morning and has progressively worsened through the day. The pain is located in the right mid abdomen. It does not radiate. Patient has associated nausea but no vomiting. No fever or chills. No chest pain, shortness of breath difficulty breathing. No blood in urine. No dysuria, frequency or urgency. Patient has not taken anything for the pain. Pain is worse with movement. No alleviating factors. Pain rated 7/10.  Pain aching.  No radiation.    Social hx  Nonsmoker  +smoker      The history is provided by the patient.        Past Medical History:   Diagnosis Date   ??? Anxiety    ??? Heroin abuse    ??? Insomnia        History reviewed. No pertinent surgical history.      History reviewed. No pertinent family history.    Social History     Social History   ??? Marital status: SINGLE     Spouse name: N/A   ??? Number of children: N/A   ??? Years of education: N/A     Occupational History   ??? Not on file.     Social History Main Topics   ??? Smoking status: Never Smoker   ??? Smokeless tobacco: Current User   ??? Alcohol use No   ??? Drug use: Yes     Special: Heroin   ??? Sexual activity: Not on file     Other Topics Concern   ??? Not on file     Social History Narrative   ??? No narrative on file         ALLERGIES: Pcn [penicillins]    Review of Systems   Constitutional: Negative for chills and fever.   HENT: Negative for congestion, rhinorrhea and sore throat.    Respiratory: Negative for cough and shortness of breath.    Cardiovascular: Negative for chest pain and palpitations.   Gastrointestinal: Positive for abdominal pain and nausea. Negative for blood in stool, diarrhea and vomiting.   Genitourinary: Negative for dysuria, flank pain, frequency, urgency, vaginal bleeding and vaginal discharge.   Musculoskeletal: Negative for back pain, myalgias, neck pain and neck stiffness.   Skin: Negative for rash and wound.    Neurological: Negative for dizziness, numbness and headaches.   All other systems reviewed and are negative.      Vitals:    10/14/16 1411   BP: 119/72   Pulse: 79   Resp: 16   Temp: 98.3 ??F (36.8 ??C)   SpO2: 98%   Weight: 53.5 kg (118 lb)   Height: 5\' 2"  (1.575 m)            Physical Exam   Constitutional: She is oriented to person, place, and time. She appears well-developed and well-nourished. No distress.   HENT:   Head: Normocephalic and atraumatic.   Right Ear: External ear normal.   Left Ear: External ear normal.   Eyes: Conjunctivae and EOM are normal. Pupils are equal, round, and reactive to light.   Neck: Normal range of motion. Neck supple.   Cardiovascular: Normal rate, regular rhythm and normal heart sounds.    Pulmonary/Chest: Effort normal and breath sounds normal. No respiratory distress. She has no wheezes.   Abdominal: Soft. Normal appearance and bowel sounds are normal. She exhibits no shifting dullness, no distension, no pulsatile liver, no abdominal bruit, no pulsatile midline mass  and no mass. There is no hepatosplenomegaly, splenomegaly or hepatomegaly. There is tenderness. There is no rigidity, no rebound, no guarding, no CVA tenderness, no tenderness at McBurney's point and negative Murphy's sign.   Abdomen exposed for exam.  Pt tender right side of abdomen. Upper and lower.  No peritoneal signs   Genitourinary: Vaginal discharge found.   Genitourinary Comments: external vulva and vagina are normal in appearance, without rash, lesions, discharge, ecchymosis or laceration. The speculum exam demonstrates normal vaginal mucosa without rash, lesions, ecchymosis or laceration; white discharge noted. The cervix is normal in appearance without rash, lesions, discharge, ecchymosis or laceration. The bimanual exam demonstrates a closed cervix without cervical motion tenderness. No adnexal tenderness bilaterally. ??     Musculoskeletal: Normal range of motion. She exhibits no edema or tenderness.    Neurological: She is alert and oriented to person, place, and time. She has normal reflexes. No cranial nerve deficit. Coordination normal.   Skin: Skin is warm and dry. No rash noted. No erythema.   Psychiatric: She has a normal mood and affect. Her behavior is normal. Judgment and thought content normal.   Nursing note and vitals reviewed.       MDM  Number of Diagnoses or Management Options  Abdominal pain, right lower quadrant:   Cyst of left ovary:   Diagnosis management comments: 31 year old female presenting for right-sided abdominal pain. Patient is tender right upper quadrant. She is in no acute distress. Lungs clear bilaterally. No peritoneal signs.  Plan: CT, intravenous fluids, Zofran, Tylenol patient is recovering substance abuse history.    Patient is well hydrated, well appearing, and in no respiratory distress. Physical exam is reassuring, and without signs of serious illness. Given the patient's history, clinical course, physical exam, and imaging findings, abdominal pain is unlikely secondary to a surgical etiology.  Patient will be discharged home with pain control and follow-up with primary care physician in one to two days.  Patient and caregivers were instructed on signs and symptoms of reasons to return including fever, worsening pain, vomiting, blood in the stool or any other concerns.          ED Course       Procedures    Pt has been seen and evaluated by attending physician who has reviewed lab work and imaging tests. She agrees with discharge and prescription plan.

## 2016-10-14 NOTE — ED Notes (Cosign Needed)
2:09 PM  I have evaluated the patient as the Provider in Triage. I have reviewed Her vital signs and the triage nurse assessment. I have talked with the patient and any available family and advised that I am the provider in triage and have ordered the appropriate study to initiate their work up based on the clinical presentation during my assessment.  I have advised that the patient will be accommodated in the Main ED as soon as possible.  I have also requested to contact the triage nurse or myself immediately if the patient experiences any changes in their condition during this brief waiting period.    New onset RLQ/right mid abdominal pain this AM.  Worse with walking.  + anorexia and nausea.  + TTP at McBurney's point on exam.      Delorse LimberLindsay H Kataya Guimont, PA

## 2016-10-14 NOTE — ED Notes (Signed)
Went over discharge instructions with pt. Pt verbalized understanding. Pt given prescription and work note. Pt ambulated out of department with steady gait.

## 2016-10-15 LAB — CHLAMYDIA/GC PCR
Chlamydia amplified: NEGATIVE
N. gonorrhea, amplified: NEGATIVE

## 2017-09-15 ENCOUNTER — Encounter (HOSPITAL_COMMUNITY): Payer: Self-pay | Admitting: Emergency Medicine

## 2017-09-15 ENCOUNTER — Emergency Department (HOSPITAL_COMMUNITY)
Admission: EM | Admit: 2017-09-15 | Discharge: 2017-09-15 | Disposition: A | Discharge status: Home / Self Care | Payer: Medicaid - Out of State | Attending: Emergency Medicine | Admitting: Emergency Medicine

## 2017-09-15 ENCOUNTER — Emergency Department (HOSPITAL_COMMUNITY): Payer: Medicaid - Out of State

## 2017-09-15 ENCOUNTER — Other Ambulatory Visit: Payer: Self-pay

## 2017-09-15 DIAGNOSIS — N939 Abnormal uterine and vaginal bleeding, unspecified: Secondary | ICD-10-CM

## 2017-09-15 DIAGNOSIS — R1030 Lower abdominal pain, unspecified: Secondary | ICD-10-CM | POA: Insufficient documentation

## 2017-09-15 DIAGNOSIS — Z332 Encounter for elective termination of pregnancy: Secondary | ICD-10-CM

## 2017-09-15 DIAGNOSIS — O036 Delayed or excessive hemorrhage following complete or unspecified spontaneous abortion: Secondary | ICD-10-CM | POA: Insufficient documentation

## 2017-09-15 DIAGNOSIS — N760 Acute vaginitis: Secondary | ICD-10-CM

## 2017-09-15 DIAGNOSIS — B9689 Other specified bacterial agents as the cause of diseases classified elsewhere: Secondary | ICD-10-CM

## 2017-09-15 LAB — CBC
HCT: 36.4 % (ref 36.0–46.0)
Hemoglobin: 11.6 g/dL — ABNORMAL LOW (ref 12.0–15.0)
MCH: 28 pg (ref 26.0–34.0)
MCHC: 31.9 g/dL (ref 30.0–36.0)
MCV: 87.7 fL (ref 78.0–100.0)
PLATELETS: 318 10*3/uL (ref 150–400)
RBC: 4.15 MIL/uL (ref 3.87–5.11)
RDW: 15.3 % (ref 11.5–15.5)
WBC: 7.5 10*3/uL (ref 4.0–10.5)

## 2017-09-15 LAB — URINALYSIS, ROUTINE W REFLEX MICROSCOPIC
Bacteria, UA: NONE SEEN
Bilirubin Urine: NEGATIVE
GLUCOSE, UA: NEGATIVE mg/dL
Ketones, ur: NEGATIVE mg/dL
NITRITE: NEGATIVE
PROTEIN: NEGATIVE mg/dL
Specific Gravity, Urine: 1.01 (ref 1.005–1.030)
pH: 7 (ref 5.0–8.0)

## 2017-09-15 LAB — WET PREP, GENITAL
Sperm: NONE SEEN
Trich, Wet Prep: NONE SEEN
Yeast Wet Prep HPF POC: NONE SEEN

## 2017-09-15 LAB — COMPREHENSIVE METABOLIC PANEL
ALT: 12 U/L — AB (ref 14–54)
AST: 16 U/L (ref 15–41)
Albumin: 4.3 g/dL (ref 3.5–5.0)
Alkaline Phosphatase: 52 U/L (ref 38–126)
Anion gap: 12 (ref 5–15)
BILIRUBIN TOTAL: 0.5 mg/dL (ref 0.3–1.2)
BUN: 10 mg/dL (ref 6–20)
CALCIUM: 9.5 mg/dL (ref 8.9–10.3)
CO2: 23 mmol/L (ref 22–32)
CREATININE: 0.68 mg/dL (ref 0.44–1.00)
Chloride: 104 mmol/L (ref 101–111)
GFR calc Af Amer: >60 mL/min (ref >60)
Glucose, Bld: 116 mg/dL — ABNORMAL HIGH (ref 65–99)
Potassium: 3.5 mmol/L (ref 3.5–5.1)
Sodium: 139 mmol/L (ref 135–145)
TOTAL PROTEIN: 8.1 g/dL (ref 6.5–8.1)

## 2017-09-15 LAB — HCG, QUANTITATIVE, PREGNANCY: hCG, Beta Chain, Quant, S: 8882 m[IU]/mL — ABNORMAL HIGH (ref <5)

## 2017-09-15 LAB — TYPE AND SCREEN
ABO/RH(D): O POS
ANTIBODY SCREEN: NEGATIVE

## 2017-09-15 MED ORDER — OXYCODONE-ACETAMINOPHEN 5-325 MG PO TABS: ORAL_TABLET | ORAL | 0 refills | Status: AC

## 2017-09-15 MED ORDER — MISOPROSTOL 200 MCG PO TABS: 200.0000 ug | ORAL_TABLET | Freq: Two times a day (BID) | ORAL | 0 refills | Status: DC

## 2017-09-15 MED ORDER — MISOPROSTOL 100 MCG PO TABS
200.0000 ug | ORAL_TABLET | Freq: Once | ORAL | Status: AC
  Administered 2017-09-15: 200 ug via ORAL
  Filled 2017-09-15: qty 2

## 2017-09-15 MED ORDER — OXYCODONE-ACETAMINOPHEN 5-325 MG PO TABS
1.0000 | ORAL_TABLET | Freq: Once | ORAL | Status: AC
  Administered 2017-09-15: 1 via ORAL
  Filled 2017-09-15: qty 1

## 2017-09-15 MED ORDER — METRONIDAZOLE 500 MG PO TABS: 500.0000 mg | ORAL_TABLET | Freq: Two times a day (BID) | ORAL | 0 refills | Status: AC

## 2017-09-15 NOTE — ED Provider Notes (Signed)
[facility] EMERGENCY DEPARTMENT Provider Note   CSN: [id number] Arrival date & time: 09/15/17  1341     History   Chief Complaint Chief Complaint  Patient presents with  . Vaginal Bleeding    HPI Kristin Davila is a 32 y.o. female.  HPI  Pt was seen at 1700. Per pt, c/o gradual onset and persistence of constant vaginal bleeding for the past 4 days. Pt states Saturday (4 days ago) she began to "take the pills to have an abortion." Pt states she took more pills on Sunday. Since then she has been having heavy vaginal bleeding, generalized weakness, and headache.  Denies pelvic pain, no dysuria/hematuria, no flank pain, no N/V/D, no fevers, no rash.    Past Medical History:  Diagnosis Date  . Anemia     There are no active problems to display for this patient.   Past Surgical History:  Procedure Laterality Date  . INDUCED ABORTION    . NO PAST SURGERIES    . VAGINAL DELIVERY      OB History    Gravida Para Term Preterm AB Living   1 1 1     1    SAB TAB Ectopic Multiple Live Births           1       Home Medications    Prior to Admission medications   Medication Sig Start Date End Date Taking? Authorizing Provider  ciprofloxacin (CIPRO) 500 MG tablet Take 1 tablet (500 mg total) by mouth 2 (two) times daily. Patient not taking: Reported on 09/02/2016 06/02/16   Pisciotta, Joni Reining, PA-C  clindamycin (CLEOCIN) 150 MG capsule Take 2 capsules (300 mg total) by mouth 4 (four) times daily. 09/02/16   Gilda Crease, MD  HYDROcodone-acetaminophen (NORCO/VICODIN) 5-325 MG tablet Take 1-2 tablets by mouth every 4 (four) hours as needed for moderate pain. 09/02/16   Gilda Crease, MD  ibuprofen (ADVIL,MOTRIN) 200 MG tablet Take 800 mg by mouth every 6 (six) hours as needed.    [provider]  ibuprofen (ADVIL,MOTRIN) 600 MG tablet Take 1 tablet (600 mg total) by mouth every 6 (six) hours as needed. Patient not taking: Reported on 09/02/2016 06/14/16    Tilda Burrow, MD  metroNIDAZOLE (FLAGYL) 500 MG tablet Take 1 tablet (500 mg total) by mouth 2 (two) times daily. One po bid x 7 days Patient not taking: Reported on 09/02/2016 06/14/16   Tilda Burrow, MD  oxyCODONE-acetaminophen (PERCOCET) 5-325 MG tablet Take 1 tablet by mouth every 4 (four) hours as needed. Patient not taking: Reported on 09/02/2016 06/14/16   Tilda Burrow, MD  oxymetazoline (AFRIN) 0.05 % nasal spray Place 1 spray into both nostrils 3 (three) times daily as needed for congestion.    [provider]    Family History No family history on file.  Social History Social History   Tobacco Use  . Smoking status: Never Smoker  . Smokeless tobacco: Never Used  Substance Use Topics  . Alcohol use: Yes    Comment: ocaqssionally  . Drug use: No     Allergies   Azo [phenazopyridine] and Penicillins   Review of Systems Review of Systems ROS: Statement: All systems negative except as marked or noted in the HPI; Constitutional: Negative for fever and chills. ; ; Eyes: Negative for eye pain, redness and discharge. ; ; ENMT: Negative for ear pain, hoarseness, nasal congestion, sinus pressure and sore throat. ; ; Cardiovascular: Negative for chest pain, palpitations,  diaphoresis, dyspnea and peripheral edema. ; ; Respiratory: Negative for cough, wheezing and stridor. ; ; Gastrointestinal: Negative for nausea, vomiting, diarrhea, abdominal pain, blood in stool, hematemesis, jaundice and rectal bleeding. . ; ; Genitourinary: Negative for dysuria, flank pain and hematuria. ; ; GYN:  No pelvic pain,+vaginal bleeding, no vaginal discharge, no vulvar pain. ;; Musculoskeletal: Negative for back pain and neck pain. Negative for swelling and trauma.; ; Skin: Negative for pruritus, rash, abrasions, blisters, bruising and skin lesion.; ; Neuro: Negative for headache, lightheadedness and neck stiffness. Negative for weakness, altered level of consciousness, altered mental  status, extremity weakness, paresthesias, involuntary movement, seizure and syncope.      Physical Exam Updated Vital Signs BP 113/72 (BP Location: Right Arm)   Pulse 90   Temp 98.4 F (36.9 C) (Oral)   Resp 18   Ht 5\' 2"  (1.575 m)   Wt 56.2 kg (124 lb)   SpO2 100%   BMI 22.68 kg/m    18:16:08 Orthostatic Vital Signs RM  Orthostatic Lying   BP- Lying: 106/67  Pulse- Lying: 97      Orthostatic Sitting  BP- Sitting: 123/81  Pulse- Sitting: 91      Orthostatic Standing at 0 minutes  BP- Standing at 0 minutes: 128/71  Pulse- Standing at 0 minutes: 102    Physical Exam 1705: Physical examination:  Nursing notes reviewed; Vital signs and O2 SAT reviewed;  Constitutional: Well developed, Well nourished, Well hydrated, In no acute distress; Head:  Normocephalic, atraumatic; Eyes: EOMI, PERRL, No scleral icterus; ENMT: Mouth and pharynx normal, Mucous membranes moist; Neck: Supple, Full range of motion, No lymphadenopathy; Cardiovascular: Regular rate and rhythm, No gallop; Respiratory: Breath sounds clear & equal bilaterally, No wheezes.  Speaking full sentences with ease, Normal respiratory effort/excursion; Chest: Nontender, Movement normal; Abdomen: Soft, Nontender, Nondistended, Normal bowel sounds; Genitourinary: No CVA tenderness. Pelvic exam performed with permission of pt and female ED tech assist during exam.  External genitalia w/o lesions. Vaginal vault without discharge, +dark blood with clots in vault.  Cervix w/o lesions, not friable, GC/chlam and wet prep obtained and sent to lab.  Bimanual exam w/o CMT, or adnexal tenderness. +suprapubic tenderness.; Extremities: Pulses normal, No tenderness, No edema, No calf edema or asymmetry.; Neuro: AA&Ox3, Major CN grossly intact.  Speech clear. No gross focal motor or sensory deficits in extremities. Climbs on and off stretcher easily by herself. Gait steady..; Skin: Color normal, Warm, Dry.; Psych:  Anxious, tearful at times.   ED  Treatments / Results  Labs (all labs ordered are listed, but only abnormal results are displayed)   EKG  EKG Interpretation None       Radiology   Procedures Procedures (including critical care time)  Medications Ordered in ED Medications  oxyCODONE-acetaminophen (PERCOCET/ROXICET) 5-325 MG per tablet 1 tablet (not administered)     Initial Impression / Assessment and Plan / ED Course  I have reviewed the triage vital signs and the nursing notes.  Pertinent labs & imaging results that were available during my care of the patient were reviewed by me and considered in my medical decision making (see chart for details).  MDM Reviewed: previous chart, nursing note and vitals Reviewed previous: labs Interpretation: labs and ultrasound    Results for orders placed or performed during the hospital encounter of 09/15/17  Wet prep, genital  Result Value Ref Range   Yeast Wet Prep HPF POC NONE SEEN NONE SEEN   Trich, Wet Prep NONE SEEN NONE SEEN  Clue Cells Wet Prep HPF POC PRESENT (A) NONE SEEN   WBC, Wet Prep HPF POC FEW (A) NONE SEEN   Sperm NONE SEEN   Comprehensive metabolic panel  Result Value Ref Range   Sodium 139 135 - 145 mmol/L   Potassium 3.5 3.5 - 5.1 mmol/L   Chloride 104 101 - 111 mmol/L   CO2 23 22 - 32 mmol/L   Glucose, Bld 116 (H) 65 - 99 mg/dL   BUN 10 6 - 20 mg/dL   Creatinine, Ser 4.09 0.44 - 1.00 mg/dL   Calcium 9.5 8.9 - 81.1 mg/dL   Total Protein 8.1 6.5 - 8.1 g/dL   Albumin 4.3 3.5 - 5.0 g/dL   AST 16 15 - 41 U/L   ALT 12 (L) 14 - 54 U/L   Alkaline Phosphatase 52 38 - 126 U/L   Total Bilirubin 0.5 0.3 - 1.2 mg/dL   GFR calc non Af Amer >60 >60 mL/min   GFR calc Af Amer >60 >60 mL/min   Anion gap 12 5 - 15  CBC  Result Value Ref Range   WBC 7.5 4.0 - 10.5 K/uL   RBC 4.15 3.87 - 5.11 MIL/uL   Hemoglobin 11.6 (L) 12.0 - 15.0 g/dL   HCT 91.4 78.2 - 95.6 %   MCV 87.7 78.0 - 100.0 fL   MCH 28.0 26.0 - 34.0 pg   MCHC 31.9 30.0 - 36.0  g/dL   RDW 21.3 08.6 - 57.8 %   Platelets 318 150 - 400 K/uL  hCG, quantitative, pregnancy  Result Value Ref Range   hCG, Beta Chain, Quant, S 8,882 (H) <5 mIU/mL  Urinalysis, Routine w reflex microscopic  Result Value Ref Range   Color, Urine YELLOW YELLOW   APPearance HAZY (A) CLEAR   Specific Gravity, Urine 1.010 1.005 - 1.030   pH 7.0 5.0 - 8.0   Glucose, UA NEGATIVE NEGATIVE mg/dL   Hgb urine dipstick LARGE (A) NEGATIVE   Bilirubin Urine NEGATIVE NEGATIVE   Ketones, ur NEGATIVE NEGATIVE mg/dL   Protein, ur NEGATIVE NEGATIVE mg/dL   Nitrite NEGATIVE NEGATIVE   Leukocytes, UA TRACE (A) NEGATIVE   RBC / HPF TOO NUMEROUS TO COUNT 0 - 5 RBC/hpf   WBC, UA 6-30 0 - 5 WBC/hpf   Bacteria, UA NONE SEEN NONE SEEN   Squamous Epithelial / LPF 0-5 (A) NONE SEEN   Mucus PRESENT   Type and screen [facility]  Result Value Ref Range   ABO/RH(D) O POS    Antibody Screen NEG    Sample Expiration      09/18/2017 Performed at [facility], [address] [place], [place] [address]    US Pelvis Transvanginal Non-ob (tv Only) Result Date: 09/15/2017 CLINICAL DATA:  Vaginal bleeding. Possible delayed products of conception. Status post therapeutic abortion 4 days ago. EXAM: TRANSABDOMINAL AND TRANSVAGINAL ULTRASOUND OF PELVIS TECHNIQUE: Both transabdominal and transvaginal ultrasound examinations of the pelvis were performed. Transabdominal technique was performed for global imaging of the pelvis including uterus, ovaries, adnexal regions, and pelvic cul-de-sac. It was necessary to proceed with endovaginal exam following the transabdominal exam to visualize the bilateral adnexal structures. COMPARISON:  No recent studies in [facility] FINDINGS: Uterus Measurements: 8.8 x 3.8 x 5.7 cm. No fibroids or other mass visualized. Endometrium Thickness: 9.7 mm. No abnormal endometrial fluid or soft tissue masses or fragments are observed. Right ovary Measurements: 4.0 x 1.3 x 3.3 cm. Normal appearance/no  adnexal mass. Left ovary Measurements: 3.1 x  1.9 x 2.5 cm. There is a subtle hypoechoic focus in the left ovary which is homogeneous in appearance which measures 2.4 x 1.3 x 2.0 cm. There is some vascularity surrounding it but none is observed within it. Other findings No free pelvic fluid. IMPRESSION: Normal appearing uterus and endometrium. No evidence of retained products of conception. Normal appearing right ovary. The left ovary exhibits a solid-appearing slightly hypoechoic focus without internal vascularity. This may reflect a hemorrhagic cyst. Follow-up ultrasound in 6-12 weeks is recommended. Electronically Signed   By: David  [place] M.D.   On: 09/15/2017 16:02   Results for BILLIEJO, SORTO (MRN [id number]) as of 09/15/2017 17:55  Ref. Range 12/24/2011 16:14 06/02/2016 02:18 06/14/2016 07:39 10/02/2016 21:01 09/15/2017 16:12  Hemoglobin Latest Ref Range: 12.0 - 15.0 g/dL 78.4 69.6 (L) 29.5 (L) 13.1 11.6 (L)  HCT Latest Ref Range: 36.0 - 46.0 % 41.7 33.3 (L) 32.7 (L) 39.8 36.4    1956:  Attempted to reassure pt several times regarding H/H stable, VSS (not orthostatic), no retained POC and vaginal bleeding is appropriate for circumstances (elective abortion). Pt insistent "no it's not!"  T/C returned from OB/GYN Dr. Despina Hidden, case discussed, including:  HPI, pertinent PM/SHx, VS/PE, dx testing, ED course and treatment:  Agrees with ED workup and EDP attempts to reassure pt, bleeding appropriate and likely will continue for the next week, can start cytotec PO now, then PO BID x2 days to help contract uterus, f/u OB/GYN MD. Will also tx for BV. Dx and testing, as well as d/w OB/GYN MD, d/w pt.  Questions answered.  Verb understanding, agreeable to d/c home with outpt f/u.     Final Clinical Impressions(s) / ED Diagnoses   Final diagnoses:  Abortion complicated by delayed or excessive hemorrhage    ED Discharge Orders    None       Samuel Jester, DO 09/20/17 1653

## 2017-09-15 NOTE — Discharge Instructions (Signed)
Take the prescriptions as directed.  Your ultrasound today showed that you do not have any retained products of conception. Avoid strenuous activity and do NOT place anything into your vagina, ie: no douching, no tampons, no sexual intercourse, no swimming or tub baths, until you are seen in follow up by your regular OB/GYN doctor. Call your regular OB/GYN doctor tomorrow to schedule a follow up appointment within the week.  Return to the Emergency Department immediately sooner if worsening.

## 2017-09-15 NOTE — ED Triage Notes (Signed)
Pt had an abortion on Saturday.  Pt was given a series of pills to take and states passing the baby on Sunday. Pt now c/o of heavy bleeding, headache, and weakness since Sunday. Pt has bled through 7 pads today.

## 2017-09-16 LAB — GC/CHLAMYDIA PROBE AMP (~~LOC~~) NOT AT ARMC
Chlamydia: NEGATIVE
NEISSERIA GONORRHEA: NEGATIVE

## 2017-09-30 ENCOUNTER — Encounter (HOSPITAL_COMMUNITY): Payer: Self-pay | Admitting: Emergency Medicine

## 2017-09-30 ENCOUNTER — Other Ambulatory Visit: Payer: Self-pay

## 2017-09-30 ENCOUNTER — Emergency Department (HOSPITAL_COMMUNITY)
Admission: EM | Admit: 2017-09-30 | Discharge: 2017-09-30 | Disposition: A | Discharge status: Home / Self Care | Payer: Medicaid - Out of State | Attending: Emergency Medicine | Admitting: Emergency Medicine

## 2017-09-30 DIAGNOSIS — R102 Pelvic and perineal pain: Secondary | ICD-10-CM

## 2017-09-30 DIAGNOSIS — N3001 Acute cystitis with hematuria: Secondary | ICD-10-CM | POA: Diagnosis not present

## 2017-09-30 DIAGNOSIS — Z79899 Other long term (current) drug therapy: Secondary | ICD-10-CM | POA: Diagnosis not present

## 2017-09-30 LAB — CBC
HCT: 35.4 % — ABNORMAL LOW (ref 36.0–46.0)
HEMOGLOBIN: 11.5 g/dL — AB (ref 12.0–15.0)
MCH: 28.5 pg (ref 26.0–34.0)
MCHC: 32.5 g/dL (ref 30.0–36.0)
MCV: 87.8 fL (ref 78.0–100.0)
PLATELETS: 340 10*3/uL (ref 150–400)
RBC: 4.03 MIL/uL (ref 3.87–5.11)
RDW: 14.9 % (ref 11.5–15.5)
WBC: 7.8 10*3/uL (ref 4.0–10.5)

## 2017-09-30 LAB — URINALYSIS, ROUTINE W REFLEX MICROSCOPIC
Bacteria, UA: NONE SEEN
Bilirubin Urine: NEGATIVE
Glucose, UA: NEGATIVE mg/dL
Ketones, ur: NEGATIVE mg/dL
Nitrite: NEGATIVE
PROTEIN: NEGATIVE mg/dL
SPECIFIC GRAVITY, URINE: 1.024 (ref 1.005–1.030)
pH: 5 (ref 5.0–8.0)

## 2017-09-30 LAB — BASIC METABOLIC PANEL
Anion gap: 10 (ref 5–15)
BUN: 10 mg/dL (ref 6–20)
CHLORIDE: 106 mmol/L (ref 101–111)
CO2: 23 mmol/L (ref 22–32)
Calcium: 9.1 mg/dL (ref 8.9–10.3)
Creatinine, Ser: 0.74 mg/dL (ref 0.44–1.00)
Glucose, Bld: 99 mg/dL (ref 65–99)
POTASSIUM: 4 mmol/L (ref 3.5–5.1)
SODIUM: 139 mmol/L (ref 135–145)

## 2017-09-30 LAB — HCG, QUANTITATIVE, PREGNANCY: HCG, BETA CHAIN, QUANT, S: 95 m[IU]/mL — AB (ref <5)

## 2017-09-30 LAB — POC URINE PREG, ED: PREG TEST UR: NEGATIVE

## 2017-09-30 MED ORDER — DOXYCYCLINE HYCLATE 100 MG PO CAPS: 100.0000 mg | ORAL_CAPSULE | Freq: Two times a day (BID) | ORAL | 0 refills | Status: AC

## 2017-09-30 MED ORDER — HYDROCODONE-ACETAMINOPHEN 5-325 MG PO TABS
1.0000 | ORAL_TABLET | Freq: Once | ORAL | Status: AC
  Administered 2017-09-30: 1 via ORAL
  Filled 2017-09-30: qty 1

## 2017-09-30 MED ORDER — CIPROFLOXACIN IN D5W 400 MG/200ML IV SOLN
400.0000 mg | Freq: Once | INTRAVENOUS | Status: AC
  Administered 2017-09-30: 400 mg via INTRAVENOUS
  Filled 2017-09-30: qty 200

## 2017-09-30 MED ORDER — SODIUM CHLORIDE 0.9 % IV SOLN: INTRAVENOUS | Status: DC

## 2017-09-30 MED ORDER — CIPROFLOXACIN HCL 500 MG PO TABS: 500.0000 mg | ORAL_TABLET | Freq: Two times a day (BID) | ORAL | 0 refills | Status: AC

## 2017-09-30 MED ORDER — NAPROXEN 500 MG PO TABS: 500.0000 mg | ORAL_TABLET | Freq: Two times a day (BID) | ORAL | 0 refills | Status: AC

## 2017-09-30 MED ORDER — SODIUM CHLORIDE 0.9 % IV BOLUS (SEPSIS)
500.0000 mL | Freq: Once | INTRAVENOUS | Status: AC
  Administered 2017-09-30: 500 mL via INTRAVENOUS

## 2017-09-30 NOTE — ED Provider Notes (Signed)
[facility] EMERGENCY DEPARTMENT Provider Note   CSN: [id number] Arrival date & time: 09/30/17  1208     History   Chief Complaint Chief Complaint  Patient presents with  . Pelvic Pain    HPI Kristin LintsKristen Davila is a 32 y.o. female.  Patient with 2-day complaint of dysuria difficulty urinating and suprapubic abdominal pain.  Patient recently seen February 26.  Vaginal bleeding at that time following an elective abortion.  Did have ultrasound done at that time that showed no retained products.  Did have pelvic with cultures cultures were negative wet prep positive for clue cells were treated with Flagyl for bacterial vaginosis.  Patient states his symptoms did resolve and then these new symptoms started about 2 days ago.  Patient states he reminds her of urinary tract infection but the pain seems to be more intense.  She feels as if she may have fevers.  No nausea or vomiting.  No vaginal discharge.      Past Medical History:  Diagnosis Date  . Anemia     There are no active problems to display for this patient.   Past Surgical History:  Procedure Laterality Date  . INDUCED ABORTION    . NO PAST SURGERIES    . VAGINAL DELIVERY      OB History    Gravida Para Term Preterm AB Living   1 1 1     1    SAB TAB Ectopic Multiple Live Births           1       Home Medications    Prior to Admission medications   Medication Sig Start Date End Date Taking? Authorizing Provider  metroNIDAZOLE (FLAGYL) 500 MG tablet Take 1 tablet (500 mg total) by mouth 2 (two) times daily. 09/15/17  Yes Samuel JesterMcManus, Kathleen, DO  Multiple Vitamin (MULTIVITAMIN WITH MINERALS) TABS tablet Take 1 tablet by mouth daily.   Yes [provider]  naproxen (NAPROSYN) 500 MG tablet Take 500 mg by mouth daily as needed for mild pain or moderate pain.   Yes [provider]  oxymetazoline (AFRIN) 0.05 % nasal spray Place 1 spray into both nostrils 3 (three) times daily as needed for congestion.    Yes [provider]  ciprofloxacin (CIPRO) 500 MG tablet Take 1 tablet (500 mg total) by mouth 2 (two) times daily. 09/30/17   Vanetta MuldersZackowski, Devonn Giampietro, MD  doxycycline (VIBRAMYCIN) 100 MG capsule Take 1 capsule (100 mg total) by mouth 2 (two) times daily. 09/30/17   Vanetta MuldersZackowski, Carolyn Sylvia, MD  misoprostol (CYTOTEC) 200 MCG tablet Take 1 tablet (200 mcg total) by mouth 2 (two) times daily for 2 days. Patient not taking: Reported on 09/30/2017 09/15/17 09/17/17  Samuel JesterMcManus, Kathleen, DO  naproxen (NAPROSYN) 500 MG tablet Take 1 tablet (500 mg total) by mouth 2 (two) times daily. 09/30/17   Vanetta MuldersZackowski, Anamarie Hunn, MD  oxyCODONE-acetaminophen (PERCOCET/ROXICET) 5-325 MG tablet 1 or 2 tabs PO q6h prn pain Patient not taking: Reported on 09/30/2017 09/15/17   Samuel JesterMcManus, Kathleen, DO    Family History History reviewed. No pertinent family history.  Social History Social History   Tobacco Use  . Smoking status: Never Smoker  . Smokeless tobacco: Never Used  Substance Use Topics  . Alcohol use: Yes    Comment: ocaqssionally  . Drug use: No     Allergies   Azo [phenazopyridine] and Penicillins   Review of Systems Review of Systems  Constitutional: Positive for fever.  HENT: Negative for congestion.  Eyes: Negative for redness.  Respiratory: Negative for shortness of breath.   Cardiovascular: Negative for chest pain.  Gastrointestinal: Positive for abdominal pain. Negative for nausea and vomiting.  Genitourinary: Positive for difficulty urinating, dysuria, frequency and pelvic pain. Negative for flank pain, vaginal bleeding and vaginal discharge.  Musculoskeletal: Negative for back pain.  Skin: Negative for rash.  Neurological: Negative for headaches.  Hematological: Does not bruise/bleed easily.  Psychiatric/Behavioral: Negative for confusion.     Physical Exam Updated Vital Signs BP 99/65 (BP Location: Right Arm)   Pulse 77   Temp 98.1 F (36.7 C) (Oral)   Resp 18   Ht 1.575 m (5\' 2" )   Wt  56.2 kg (124 lb)   LMP 07/21/2017 (Approximate)   SpO2 99%   BMI 22.68 kg/m   Physical Exam  Constitutional: She is oriented to person, place, and time. She appears well-developed and well-nourished. No distress.  HENT:  Head: Normocephalic and atraumatic.  Mouth/Throat: Oropharynx is clear and moist.  Eyes: Conjunctivae and EOM are normal. Pupils are equal, round, and reactive to light.  Neck: Neck supple.  Cardiovascular: Normal rate, regular rhythm and normal heart sounds.  Pulmonary/Chest: Effort normal and breath sounds normal. No respiratory distress.  Abdominal: Soft. Bowel sounds are normal.  Tenderness to palpation suprapubic area.  Genitourinary: No vaginal discharge found.  Genitourinary Comments: Since patient just had pelvic exam done in February bimanual was no discharge in the vaginal vault no bleeding.  Some slight cervical motion tenderness mild uterine tenderness no adnexal tenderness.  Musculoskeletal: Normal range of motion. She exhibits no edema.  Neurological: She is alert and oriented to person, place, and time. No cranial nerve deficit or sensory deficit. She exhibits normal muscle tone. Coordination normal.  Skin: Skin is warm.  Nursing note and vitals reviewed.    ED Treatments / Results  Labs (all labs ordered are listed, but only abnormal results are displayed) Labs Reviewed  URINALYSIS, ROUTINE W REFLEX MICROSCOPIC - Abnormal; Notable for the following components:      Result Value   APPearance CLOUDY (*)    Hgb urine dipstick MODERATE (*)    Leukocytes, UA LARGE (*)    Squamous Epithelial / LPF 0-5 (*)    Non Squamous Epithelial 0-5 (*)    All other components within normal limits  HCG, QUANTITATIVE, PREGNANCY - Abnormal; Notable for the following components:   hCG, Beta Chain, Quant, S 95 (*)    All other components within normal limits  CBC - Abnormal; Notable for the following components:   Hemoglobin 11.5 (*)    HCT 35.4 (*)    All other  components within normal limits  URINE CULTURE  BASIC METABOLIC PANEL  POC URINE PREG, ED    EKG  EKG Interpretation None       Radiology No results found.  Procedures Procedures (including critical care time)  Medications Ordered in ED Medications  0.9 %  sodium chloride infusion ( Intravenous Stopped 09/30/17 1731)  sodium chloride 0.9 % bolus 500 mL (0 mLs Intravenous Stopped 09/30/17 1509)  ciprofloxacin (CIPRO) IVPB 400 mg (0 mg Intravenous Stopped 09/30/17 1616)  HYDROcodone-acetaminophen (NORCO/VICODIN) 5-325 MG per tablet 1 tablet (1 tablet Oral Given 09/30/17 1448)     Initial Impression / Assessment and Plan / ED Course  I have reviewed the triage vital signs and the nursing notes.  Pertinent labs & imaging results that were available during my care of the patient were reviewed by me and considered  in my medical decision making (see chart for details).    Patient symptoms mostly seem to be consistent with urinary tract infection.  Patient does have a cyst significant allergy to penicillin to include anaphylaxis.  So cephalosporins not used in her treatment.  She was treated with Cipro for the UTI.  On bimanual pelvic exam there was some uterine tenderness but difficult to differentiate there is a suprapubic.  But to be on the safe side patient will also be treated with doxycycline.  Rocephin not an option for treatment here.  Also patient's cultures from  February were negative.  No discharge currently.  Also could be some chronic pelvic pain.  Patient's quantitative hCG has dropped significantly from her visit in February is now down to 95.  Seems appropriate.  Ultrasound done in February showed no retained products or abnormalities.  Patient was referred to [facility] with [facility] but that she did not make the appointment.  Have encouraged her that it is important that she follow-up this time.  She states she will make the appointment.  Patient given precautions  to return if not improving some in 2 days or for any new or worse symptoms.  Clinically feel that this is probably a urinary tract infection.  Doubt that there is a serious pelvic infection ongoing.  However will treat.  Does need OB/GYN follow-up.   Final Clinical Impressions(s) / ED Diagnoses   Final diagnoses:  Acute cystitis with hematuria  Pelvic pain in female    ED Discharge Orders        Ordered    ciprofloxacin (CIPRO) 500 MG tablet  2 times daily     09/30/17 1718    doxycycline (VIBRAMYCIN) 100 MG capsule  2 times daily     09/30/17 1718    naproxen (NAPROSYN) 500 MG tablet  2 times daily     09/30/17 1719       Vanetta Mulders, MD 09/30/17 1912

## 2017-09-30 NOTE — Discharge Instructions (Signed)
Make an appointment to follow-up with OB/GYN in the next few days.  Take both antibiotics as directed.  Return for any new or worse symptoms.  Would expect improvement over the next couple days.

## 2017-09-30 NOTE — ED Triage Notes (Signed)
Pt reports she had an abortion late last month and now has pelvic pain and dysuria. Polyuria and oliguria noted. Denies vaginal d/c or bleeding at this time. States she took a "pill" for abortion and denies procedure.

## 2017-10-02 LAB — URINE CULTURE

## 2017-11-15 ENCOUNTER — Emergency Department (HOSPITAL_COMMUNITY)
Admission: EM | Admit: 2017-11-15 | Discharge: 2017-11-16 | Disposition: A | Discharge status: Home / Self Care | Payer: Medicaid - Out of State | Attending: Emergency Medicine | Admitting: Emergency Medicine

## 2017-11-15 ENCOUNTER — Other Ambulatory Visit: Payer: Self-pay

## 2017-11-15 ENCOUNTER — Emergency Department (HOSPITAL_COMMUNITY): Payer: Medicaid - Out of State

## 2017-11-15 ENCOUNTER — Encounter (HOSPITAL_COMMUNITY): Payer: Self-pay | Admitting: *Deleted

## 2017-11-15 DIAGNOSIS — K0889 Other specified disorders of teeth and supporting structures: Secondary | ICD-10-CM

## 2017-11-15 DIAGNOSIS — R079 Chest pain, unspecified: Secondary | ICD-10-CM | POA: Insufficient documentation

## 2017-11-15 DIAGNOSIS — Z79899 Other long term (current) drug therapy: Secondary | ICD-10-CM | POA: Diagnosis not present

## 2017-11-15 LAB — CBC
HCT: 36.4 % (ref 36.0–46.0)
Hemoglobin: 11.7 g/dL — ABNORMAL LOW (ref 12.0–15.0)
MCH: 28.3 pg (ref 26.0–34.0)
MCHC: 32.1 g/dL (ref 30.0–36.0)
MCV: 87.9 fL (ref 78.0–100.0)
PLATELETS: 300 10*3/uL (ref 150–400)
RBC: 4.14 MIL/uL (ref 3.87–5.11)
RDW: 13.4 % (ref 11.5–15.5)
WBC: 5.7 10*3/uL (ref 4.0–10.5)

## 2017-11-15 LAB — BASIC METABOLIC PANEL
Anion gap: 10 (ref 5–15)
BUN: 12 mg/dL (ref 6–20)
CO2: 25 mmol/L (ref 22–32)
Calcium: 9.1 mg/dL (ref 8.9–10.3)
Chloride: 106 mmol/L (ref 101–111)
Creatinine, Ser: 0.76 mg/dL (ref 0.44–1.00)
GFR calc Af Amer: >60 mL/min (ref >60)
GFR calc non Af Amer: >60 mL/min (ref >60)
Glucose, Bld: 90 mg/dL (ref 65–99)
Potassium: 3.8 mmol/L (ref 3.5–5.1)
Sodium: 141 mmol/L (ref 135–145)

## 2017-11-15 LAB — I-STAT TROPONIN, ED: Troponin i, poc: 0 ng/mL (ref 0.00–0.08)

## 2017-11-15 LAB — I-STAT BETA HCG BLOOD, ED (MC, WL, AP ONLY): I-stat hCG, quantitative: <5 m[IU]/mL (ref <5)

## 2017-11-15 MED ORDER — MAGIC MOUTHWASH
ORAL | Status: AC
  Administered 2017-11-15: 5 mL
  Filled 2017-11-15: qty 5

## 2017-11-15 MED ORDER — ACETAMINOPHEN 500 MG PO TABS
1000.0000 mg | ORAL_TABLET | Freq: Once | ORAL | Status: AC
  Administered 2017-11-15: 1000 mg via ORAL
  Filled 2017-11-15: qty 2

## 2017-11-15 MED ORDER — CLINDAMYCIN HCL 150 MG PO CAPS
450.0000 mg | ORAL_CAPSULE | Freq: Once | ORAL | Status: AC
  Administered 2017-11-15: 450 mg via ORAL
  Filled 2017-11-15: qty 3

## 2017-11-15 MED ORDER — MAGIC MOUTHWASH W/LIDOCAINE
5.0000 mL | Freq: Once | ORAL | Status: AC
  Administered 2017-11-15: 5 mL via ORAL
  Filled 2017-11-15: qty 5

## 2017-11-15 MED ORDER — KETOROLAC TROMETHAMINE 60 MG/2ML IM SOLN
30.0000 mg | Freq: Once | INTRAMUSCULAR | Status: AC
  Administered 2017-11-15: 30 mg via INTRAMUSCULAR
  Filled 2017-11-15: qty 2

## 2017-11-15 NOTE — ED Provider Notes (Signed)
[facility] EMERGENCY DEPARTMENT Provider Note   CSN: [id number] Arrival date & time: 11/15/17  2129     History   Chief Complaint Chief Complaint  Patient presents with  . Chest Pain    HPI Kristin Davila is a 32 y.o. female with past medical history significant for anemia presenting with progressive onset chest pressure and feeling as though someone is sitting on her chest since finishing a night shift this morning.  She reports being under a lot of stress and has had anxiety attacks before but has never had chest pressure like this.  She denies exertional component, associated dizziness, nausea, vomiting, diaphoresis. She is attributing some of this to possibly the severe dental pain she has been experiencing.  Months ago she had chipped the tooth but was able to manage her pain with over-the-counter medications.  Last week she was biting on a steak and she felt the severe pain that has been worsening since.  She has not been able to get in to see a dentist.  No shortness of breath, history of DVT/PE, prolonged immobilization, recent travel, recent surgery, estrogen use, lower extremity edema or pain.  HPI  Past Medical History:  Diagnosis Date  . Anemia     There are no active problems to display for this patient.   Past Surgical History:  Procedure Laterality Date  . INDUCED ABORTION    . NO PAST SURGERIES    . VAGINAL DELIVERY       OB History    Gravida  1   Para  1   Term  1   Preterm      AB      Living  1     SAB      TAB      Ectopic      Multiple      Live Births  1            Home Medications    Prior to Admission medications   Medication Sig Start Date End Date Taking? Authorizing Provider  naproxen (NAPROSYN) 500 MG tablet Take 1 tablet (500 mg total) by mouth 2 (two) times daily. 09/30/17  Yes Vanetta Mulders, MD  Prenatal Vit-Fe Fumarate-FA (PRENATAL MULTIVITAMIN) TABS tablet Take 1 tablet by mouth daily at 12 noon.   Yes  [provider]  ciprofloxacin (CIPRO) 500 MG tablet Take 1 tablet (500 mg total) by mouth 2 (two) times daily. Patient not taking: Reported on 11/15/2017 09/30/17   Vanetta Mulders, MD  clindamycin (CLEOCIN) 150 MG capsule Take 3 capsules (450 mg total) by mouth 3 (three) times daily for 7 days. 11/16/17 11/23/17  Mathews Robinsons B, PA-C  doxycycline (VIBRAMYCIN) 100 MG capsule Take 1 capsule (100 mg total) by mouth 2 (two) times daily. Patient not taking: Reported on 11/15/2017 09/30/17   Vanetta Mulders, MD  magic mouthwash w/lidocaine SOLN Take 5 mLs by mouth 3 (three) times daily as needed for mouth pain. 11/16/17   Georgiana Shore, PA-C  metroNIDAZOLE (FLAGYL) 500 MG tablet Take 1 tablet (500 mg total) by mouth 2 (two) times daily. Patient not taking: Reported on 11/15/2017 09/15/17   Samuel Jester, DO  oxyCODONE-acetaminophen (PERCOCET/ROXICET) 5-325 MG tablet 1 or 2 tabs PO q6h prn pain Patient not taking: Reported on 09/30/2017 09/15/17   Samuel Jester, DO    Family History No family history on file.  Social History Social History   Tobacco Use  . Smoking status: Never Smoker  . Smokeless  tobacco: Never Used  Substance Use Topics  . Alcohol use: Yes    Comment: ocaqssionally  . Drug use: No     Allergies   Azo [phenazopyridine] and Penicillins   Review of Systems Review of Systems  Constitutional: Negative for chills, diaphoresis, fatigue and fever.  HENT: Positive for dental problem. Negative for ear pain, sore throat, tinnitus, trouble swallowing and voice change.   Respiratory: Negative for cough, choking, chest tightness, shortness of breath, wheezing and stridor.   Cardiovascular: Positive for chest pain. Negative for palpitations and leg swelling.       Chest pressure non radiating  Gastrointestinal: Negative for abdominal distention, abdominal pain, nausea and vomiting.  Genitourinary: Negative for difficulty urinating, dysuria and hematuria.    Musculoskeletal: Negative for arthralgias, back pain, myalgias, neck pain and neck stiffness.  Skin: Negative for color change, pallor and rash.  Neurological: Negative for dizziness, seizures, syncope, light-headedness and headaches.     Physical Exam Updated Vital Signs BP 109/67   Pulse (!) 58   Temp 97.8 F (36.6 C) (Oral)   Resp 18   Ht  (1.575 m)   Wt 54.4 kg (120 lb)   LMP 10/29/2017   SpO2 100%   BMI 21.95 kg/m   Physical Exam  Constitutional: She is oriented to person, place, and time. She appears well-developed and well-nourished.  Non-toxic appearance. She does not appear ill. No distress.  Well-appearing, nontoxic afebrile sitting comfortably in bed no acute distress.  HENT:  Head: Normocephalic and atraumatic.  Mouth/Throat: Uvula is midline, oropharynx is clear and moist and mucous membranes are normal. Mucous membranes are not pale and not dry. No trismus in the jaw. Abnormal dentition. Dental caries present. No dental abscesses or uvula swelling. No oropharyngeal exudate, posterior oropharyngeal edema, posterior oropharyngeal erythema or tonsillar abscesses. No tonsillar exudate.    No gross oral abscess. Uvula is midline, arches are simmetrical and intact. No peritonsilar swelling or exudate. No trismus. Sublingual mucosa is soft and non-tender. Tolerating oral secretions. No concern for ludwig's angina.   Eyes: Conjunctivae and EOM are normal.  Neck: Normal range of motion. Neck supple.  Cardiovascular: Normal rate, regular rhythm, normal heart sounds and intact distal pulses.  No murmur heard. Pulmonary/Chest: Effort normal and breath sounds normal. No accessory muscle usage or stridor. No respiratory distress. She has no decreased breath sounds. She has no wheezes. She has no rhonchi. She has no rales.  Abdominal: She exhibits no distension.  Musculoskeletal: Normal range of motion. She exhibits no edema.       Right lower leg: Normal. She exhibits no  tenderness and no edema.       Left lower leg: Normal. She exhibits no tenderness and no edema.  Neurological: She is alert and oriented to person, place, and time.  Skin: Skin is warm and dry. No rash noted. She is not diaphoretic. No erythema. No pallor.  Psychiatric: She has a normal mood and affect.  Nursing note and vitals reviewed.    ED Treatments / Results  Labs (all labs ordered are listed, but only abnormal results are displayed) Labs Reviewed  CBC - Abnormal; Notable for the following components:      Result Value   Hemoglobin 11.7 (*)    All other components within normal limits  BASIC METABOLIC PANEL  I-STAT TROPONIN, ED  I-STAT BETA HCG BLOOD, ED ([facility], [facility], [facility] ONLY)    EKG None  Normal sinus rhythm  Radiology Dg Chest 2 View  Result Date: 11/15/2017 CLINICAL DATA:  Left-sided dental pain starting a few days ago now presents with left chest pain. EXAM: CHEST - 2 VIEW COMPARISON:  06/12/2017 FINDINGS: The heart size and mediastinal contours are within normal limits. Peripheral lucencies about both lungs compatible with artifacts due to superimposition of the patient's arms over the periphery of the chest. These are not felt to represent bilateral pneumothoraces. No pulmonary consolidation or CHF. No effusion or pneumothorax. No acute osseous abnormality. IMPRESSION: No active cardiopulmonary disease. Arm artifacts account for the lucencies involving the periphery of both lungs. Electronically Signed   By: Tollie Eth M.D.   On: 11/15/2017 22:35    Procedures Procedures (including critical care time)  Medications Ordered in ED Medications  magic mouthwash w/lidocaine (5 mLs Oral Given 11/15/17 2256)  ketorolac (TORADOL) injection 30 mg (30 mg Intramuscular Given 11/15/17 2242)  acetaminophen (TYLENOL) tablet 1,000 mg (1,000 mg Oral Given 11/15/17 2256)  clindamycin (CLEOCIN) capsule 450 mg (450 mg Oral Given 11/15/17 2243)  magic mouthwash oral solution (5 mLs  Given  11/15/17 2243)     Initial Impression / Assessment and Plan / ED Course  I have reviewed the triage vital signs and the nursing notes.  Pertinent labs & imaging results that were available during my care of the patient were reviewed by me and considered in my medical decision making (see chart for details).     Patient with toothache.  No gross abscess.  Exam unconcerning for Ludwig's angina or spread of infection.  Will treat with clindamycin and pain medicine.  Urged patient to follow-up with dentist.    Also presents with constant chest pressure since this morning. Labs, chest x-ray, EKG unremarkable  PERC negative Heart score: 1  Patient reported modest improvement from medications given the emergency department.  Will discharge patient home with a biotics, symptomatic relief and close follow-up with a dentist.  First dose of antibiotic was given in the emergency department.  Is well-appearing, nontoxic afebrile and hemodynamically stable.  Discussed return precautions and patient understood and agreed with plan.  Final Clinical Impressions(s) / ED Diagnoses   Final diagnoses:  Nonspecific chest pain  Pain, dental    ED Discharge Orders        Ordered    magic mouthwash w/lidocaine SOLN  3 times daily PRN     11/16/17 0004    clindamycin (CLEOCIN) 150 MG capsule  3 times daily     11/16/17 0009       Georgiana Shore, PA-C 11/16/17 Lorin Picket    Eber Hong, MD 11/25/17 1014

## 2017-11-15 NOTE — ED Triage Notes (Signed)
Pt c/o left side dental pain that started a few days ago, today pt states that she started having left side chest pain as well.

## 2017-11-15 NOTE — ED Provider Notes (Signed)
ED ECG REPORT  I personally interpreted this EKG   Date: 11/15/2017   Rate: 79  Rhythm: normal sinus rhythm  QRS Axis: normal  Intervals: normal  ST/T Wave abnormalities: normal  Conduction Disutrbances:none  Narrative Interpretation:   Old EKG Reviewed: none available    Eber Hong, MD 11/15/17 2350

## 2017-11-16 MED ORDER — CLINDAMYCIN HCL 150 MG PO CAPS: 450.0000 mg | ORAL_CAPSULE | Freq: Three times a day (TID) | ORAL | 0 refills | Status: AC

## 2017-11-16 MED ORDER — MAGIC MOUTHWASH W/LIDOCAINE: 5.0000 mL | Freq: Three times a day (TID) | ORAL | 0 refills | Status: AC | PRN

## 2017-11-16 NOTE — Discharge Instructions (Signed)
As discussed, use the Magic mouthwash 3 times daily as needed and take ibuprofen 800 mg every 8 hours, Tylenol between doses up to 1000 mg per dose and do not exceed 4000 mg total of acetaminophen in 24-hour period. Apply heat to the area and follow-up with a dentist as soon as possible.  Take your entire course of antibiotics even if you feel better. Return if symptoms worsen or new concerning symptoms in the meantime.

## 2018-08-28 IMAGING — US US TRANSVAGINAL NON-OB
1 series · 15 of 25 positions shown · non-contrast
Comparison: 06/02/2016

CLINICAL DATA: Evaluate for retained products of conception,
persistent pelvic pain following vaginal delivery

EXAM:
ULTRASOUND PELVIS TRANSVAGINAL
TECHNIQUE: Transvaginal ultrasound examination of the pelvis was performed
including evaluation of the uterus, ovaries, adnexal regions, and
pelvic cul-de-sac.

[Series 1: us transvaginal non-ob · 15 of 54 slices shown]
[im 1/54]
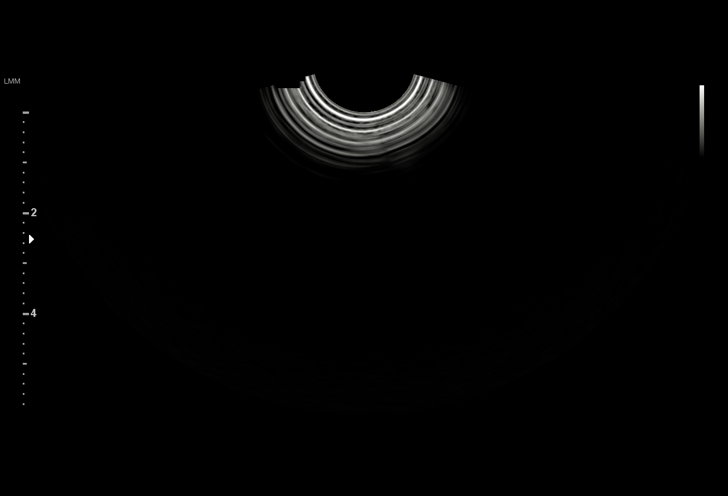
[im 5/54]
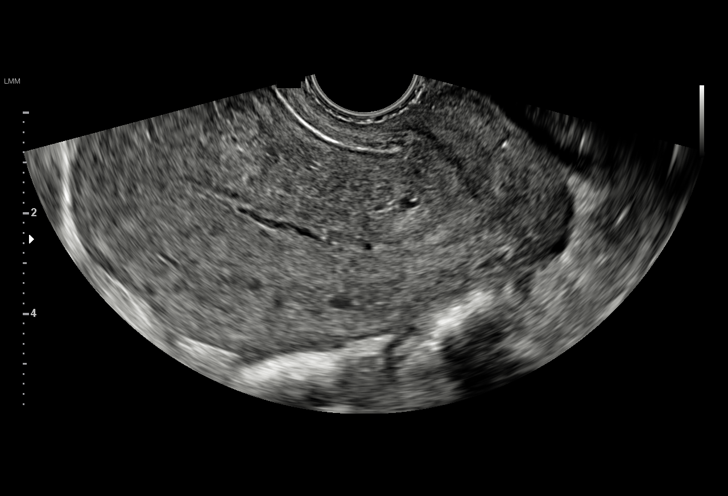
[im 9/54]
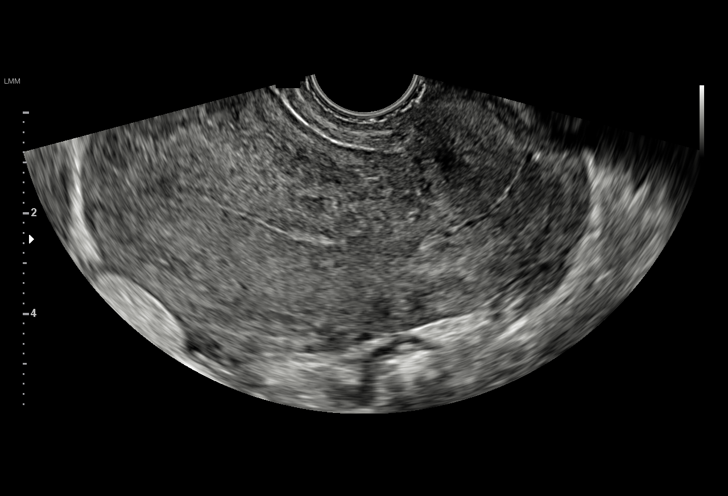
[im 12/54]
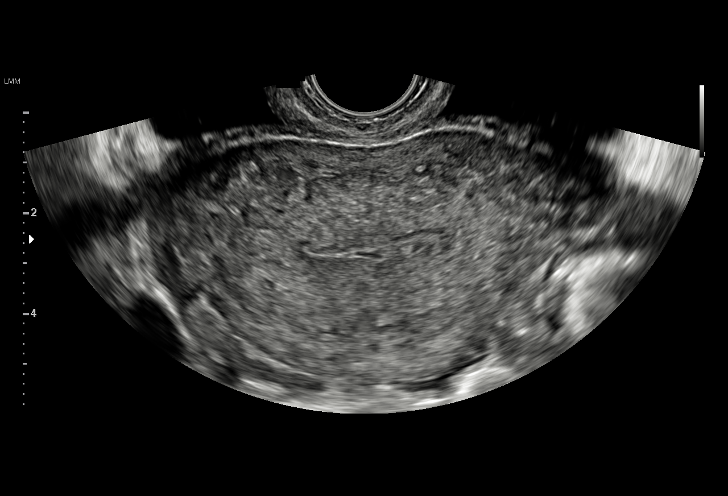
[im 16/54]
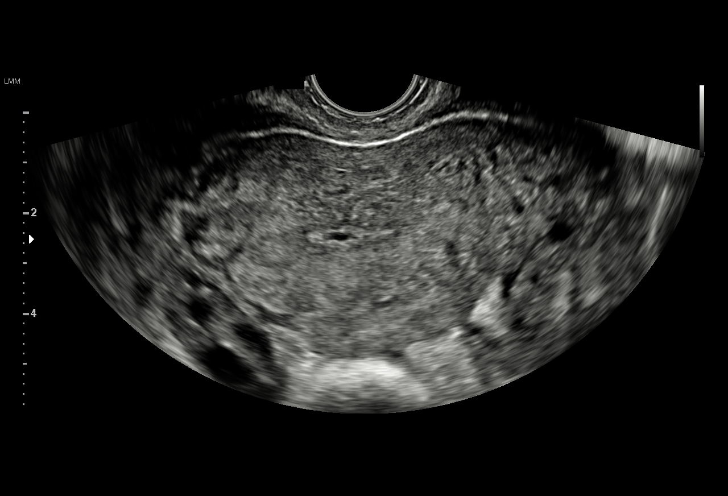
[im 20/54]
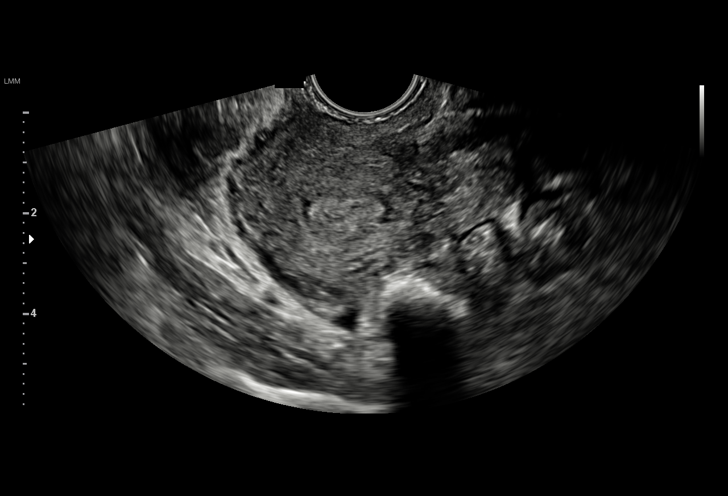
[im 23/54]
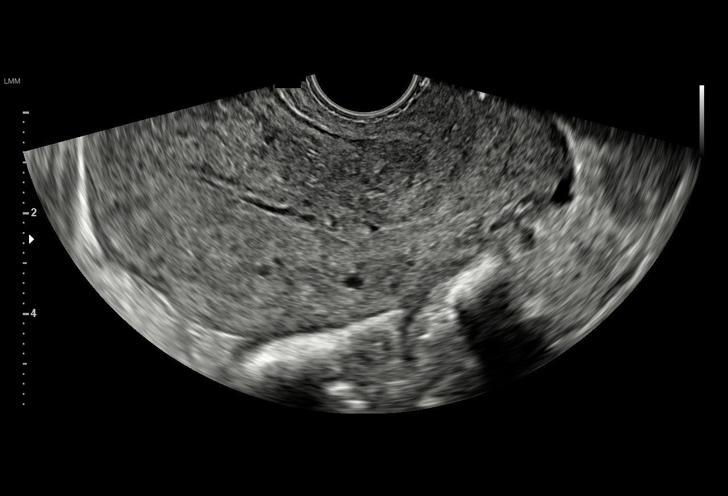
[im 27/54]
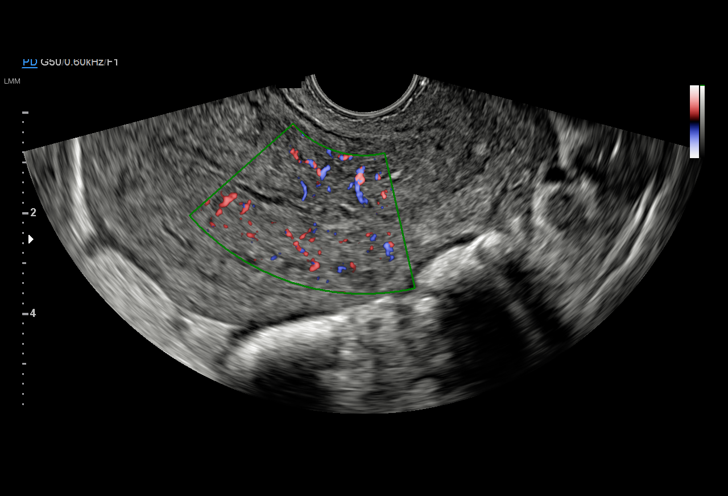
[im 31/54]
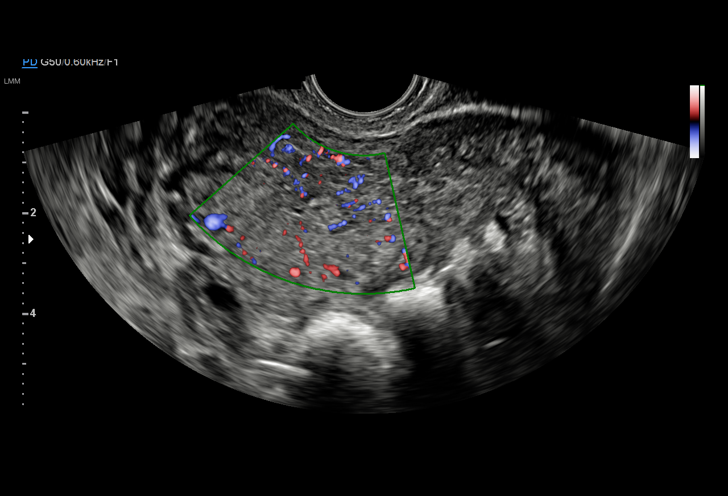
[im 34/54]
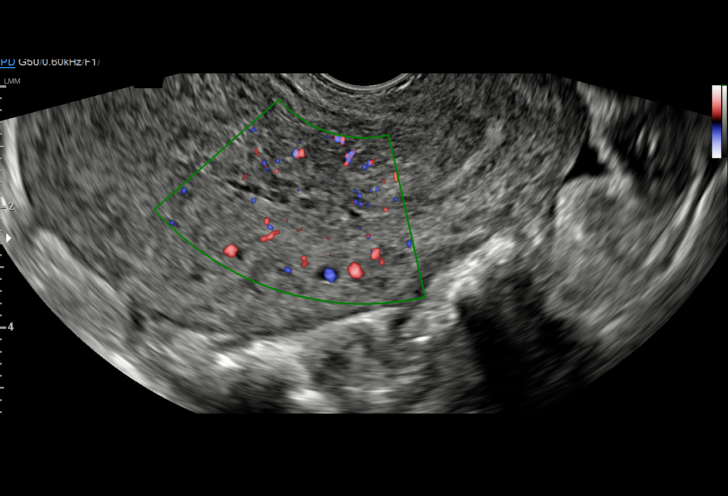
[im 38/54]
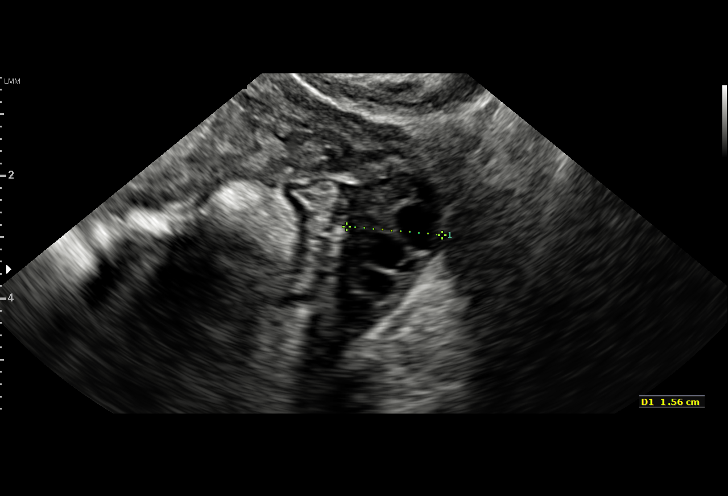
[im 42/54]
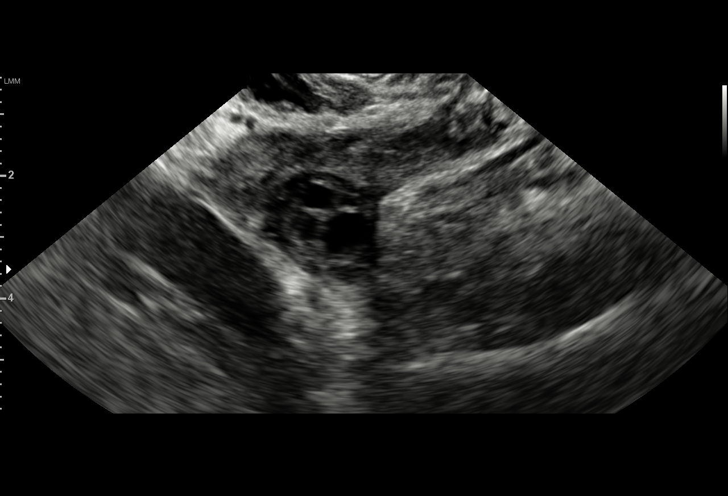
[im 45/54]
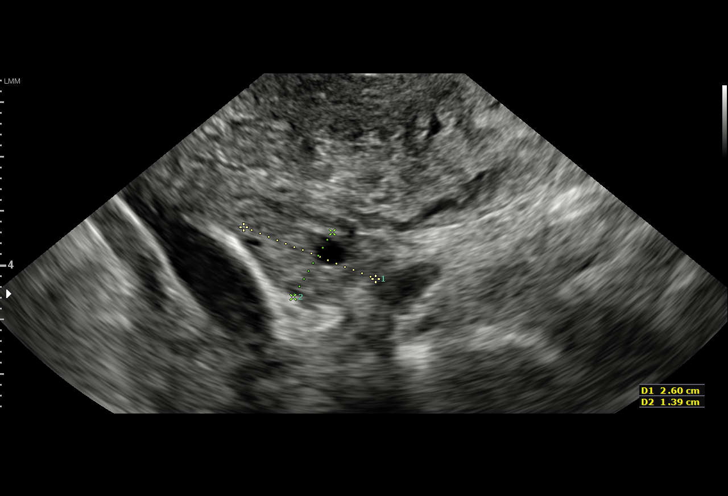
[im 49/54]
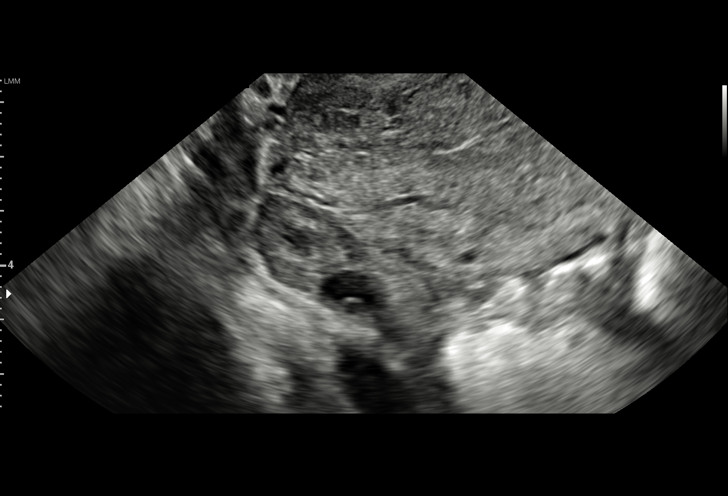
[im 54/54]
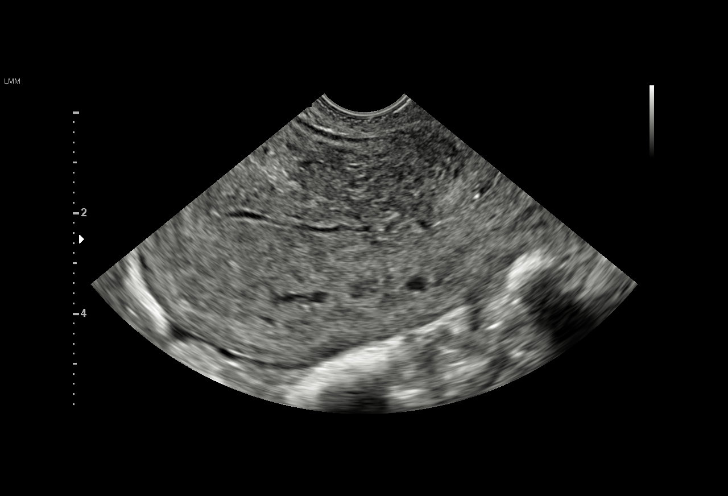

[15 of 25 positions shown; findings below may reference images not displayed]

FINDINGS: Uterus

Measurements: 10.0 x 5.6 x 8.2 cm.. No fibroids or other mass
visualized.

Endometrium

Mild fluid is noted within the endometrial canal. Minimal echogenic
material is noted in the distal aspect of the endometrial canal just
above the cervix which measures 1.0 x 0.7 cm. No significant
increased vascularity is noted.

Right ovary

Measurements: 2.6 x 1.4 x 2.1 cm.. Normal appearance/no adnexal
mass.

Left ovary

Measurements: 2.5 x 2.0 x 1.6 cm.. Normal appearance/no adnexal
mass.

Other findings:  No abnormal free fluid
IMPRESSION: Minimal fluid within the endometrial canal.

Persistent minimal echogenic material in the distal aspect of the
endometrial canal which may represent some continued retained
products of conception.

## 2018-12-31 ENCOUNTER — Emergency Department (HOSPITAL_COMMUNITY)
Admission: EM | Admit: 2018-12-31 | Discharge: 2018-12-31 | Disposition: A | Discharge status: Home / Self Care | Payer: Medicaid - Out of State | Attending: Emergency Medicine | Admitting: Emergency Medicine

## 2018-12-31 ENCOUNTER — Other Ambulatory Visit: Payer: Self-pay

## 2018-12-31 DIAGNOSIS — Z5321 Procedure and treatment not carried out due to patient leaving prior to being seen by health care provider: Secondary | ICD-10-CM | POA: Diagnosis not present

## 2018-12-31 DIAGNOSIS — R109 Unspecified abdominal pain: Secondary | ICD-10-CM | POA: Insufficient documentation

## 2018-12-31 LAB — CBC
HCT: 39.6 % (ref 36.0–46.0)
Hemoglobin: 12.9 g/dL (ref 12.0–15.0)
MCH: 29.2 pg (ref 26.0–34.0)
MCHC: 32.6 g/dL (ref 30.0–36.0)
MCV: 89.6 fL (ref 80.0–100.0)
Platelets: 305 10*3/uL (ref 150–400)
RBC: 4.42 MIL/uL (ref 3.87–5.11)
RDW: 12.9 % (ref 11.5–15.5)
WBC: 5.6 10*3/uL (ref 4.0–10.5)
nRBC: 0 % (ref 0.0–0.2)

## 2018-12-31 LAB — COMPREHENSIVE METABOLIC PANEL
ALT: 25 U/L (ref 0–44)
AST: 29 U/L (ref 15–41)
Albumin: 3.9 g/dL (ref 3.5–5.0)
Alkaline Phosphatase: 69 U/L (ref 38–126)
Anion gap: 12 (ref 5–15)
BUN: 10 mg/dL (ref 6–20)
CO2: 25 mmol/L (ref 22–32)
Calcium: 9 mg/dL (ref 8.9–10.3)
Chloride: 100 mmol/L (ref 98–111)
Creatinine, Ser: 0.69 mg/dL (ref 0.44–1.00)
GFR calc Af Amer: >60 mL/min (ref >60)
GFR calc non Af Amer: >60 mL/min (ref >60)
Glucose, Bld: 97 mg/dL (ref 70–99)
Potassium: 4 mmol/L (ref 3.5–5.1)
Sodium: 137 mmol/L (ref 135–145)
Total Bilirubin: 0.5 mg/dL (ref 0.3–1.2)
Total Protein: 7.8 g/dL (ref 6.5–8.1)

## 2018-12-31 LAB — LIPASE, BLOOD: Lipase: 27 U/L (ref 11–51)

## 2018-12-31 MED ORDER — SODIUM CHLORIDE 0.9% FLUSH: 3.0000 mL | Freq: Once | INTRAVENOUS | Status: DC

## 2018-12-31 NOTE — ED Notes (Signed)
Patient not present in waiting area. 

## 2018-12-31 NOTE — ED Triage Notes (Signed)
C/o burning sensation when she eats, states it radiates into her abdomen, state her sides hurt from vomiting

## 2018-12-31 NOTE — ED Notes (Signed)
Called for room, no answer

## 2018-12-31 NOTE — ED Triage Notes (Signed)
Sitting in waiting area sucking a lolipop on arrival

## 2019-12-28 IMAGING — US US TRANSVAGINAL NON-OB
1 series · 13 of 25 positions shown · non-contrast
Comparison: No recent studies in PACs

CLINICAL DATA: Vaginal bleeding. Possible delayed products of
conception. Status post therapeutic abortion 4 days ago.

EXAM:
TRANSABDOMINAL AND TRANSVAGINAL ULTRASOUND OF PELVIS
TECHNIQUE: Both transabdominal and transvaginal ultrasound examinations of the
pelvis were performed. Transabdominal technique was performed for
global imaging of the pelvis including uterus, ovaries, adnexal
regions, and pelvic cul-de-sac. It was necessary to proceed with
endovaginal exam following the transabdominal exam to visualize the
bilateral adnexal structures..

[Series 1: us transvaginal non-ob · 0.18mm/px · 13 of 101 slices shown]
[im 1/101]
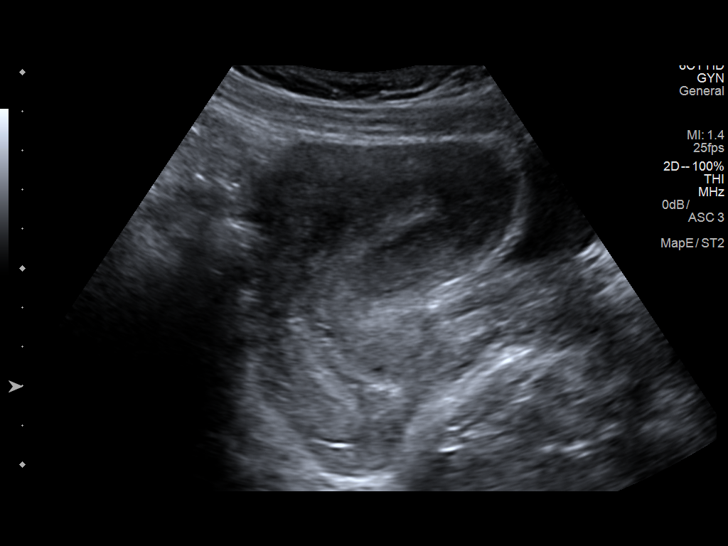
[im 9/101]
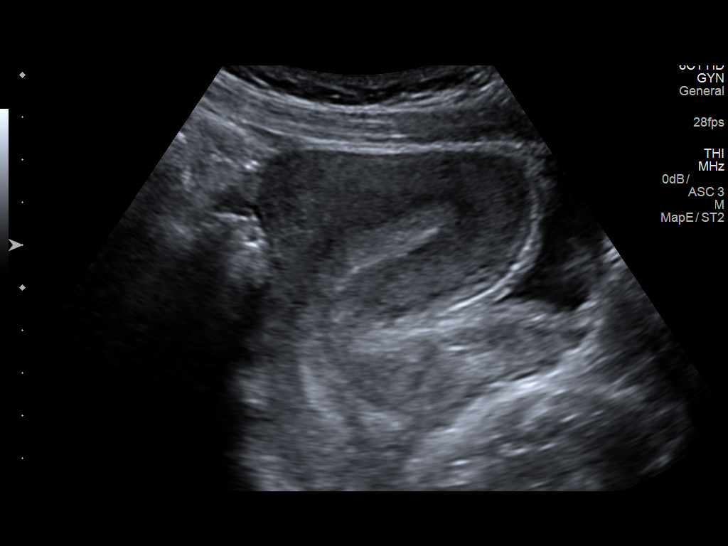
[im 17/101]
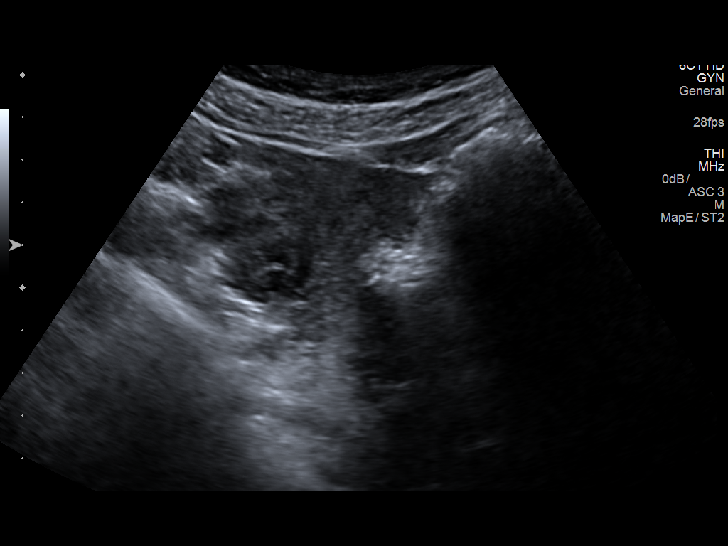
[im 26/101]
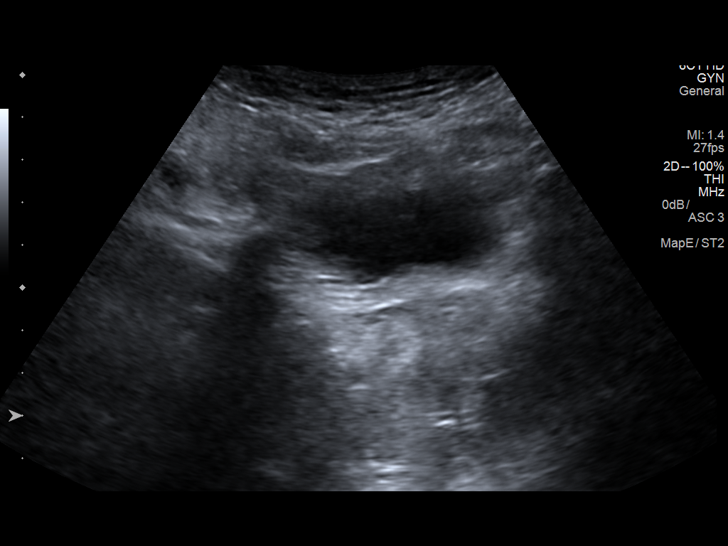
[im 34/101]
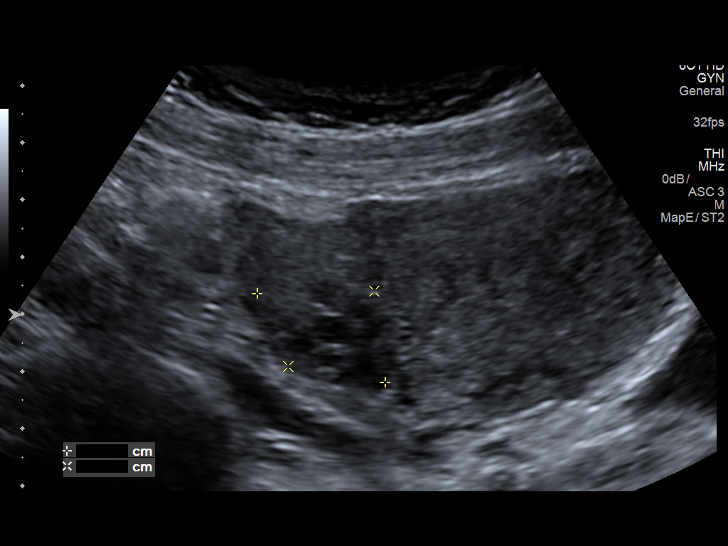
[im 42/101]
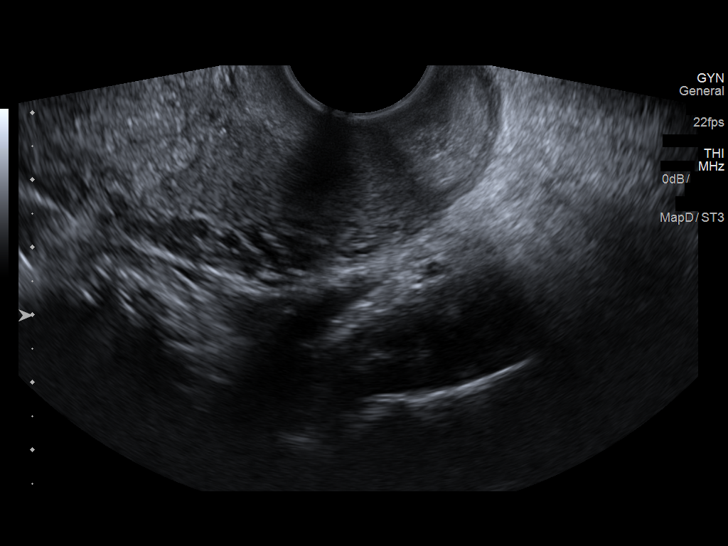
[im 51/101]
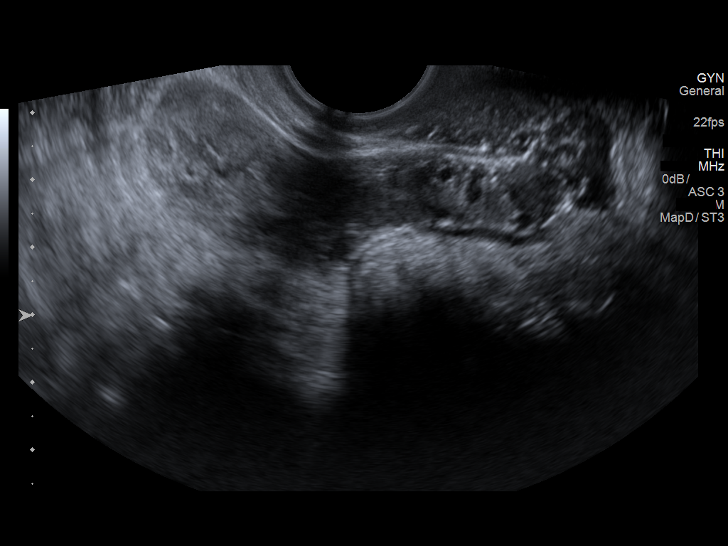
[im 59/101]
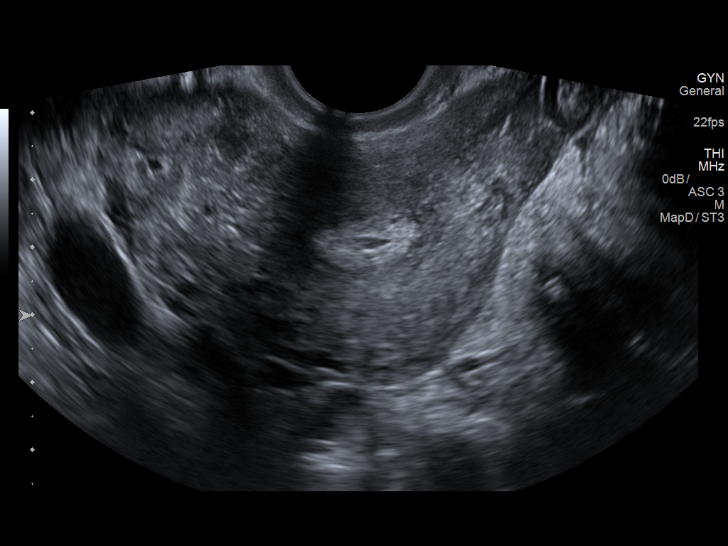
[im 67/101]
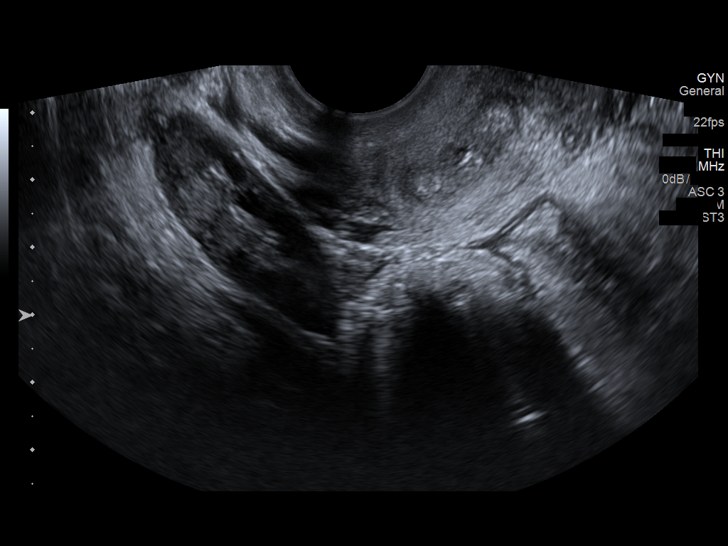
[im 76/101]
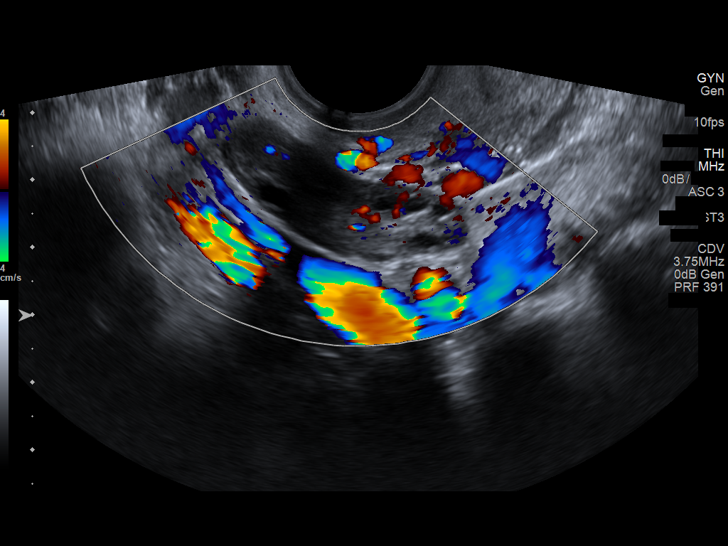
[im 84/101]
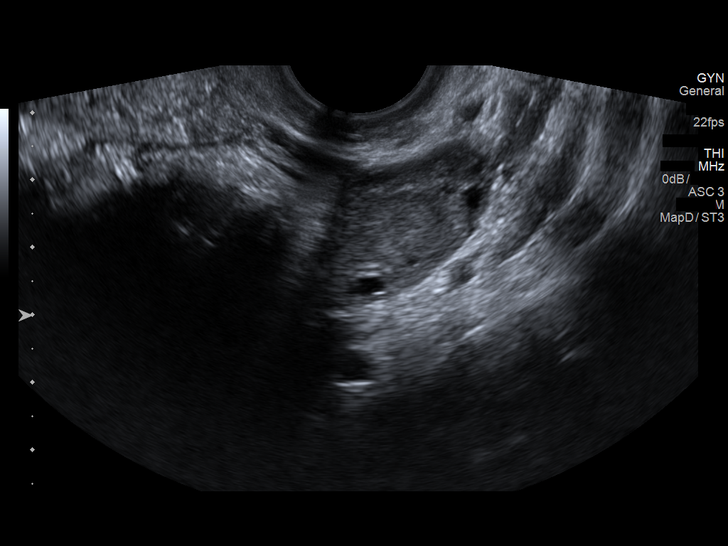
[im 92/101]
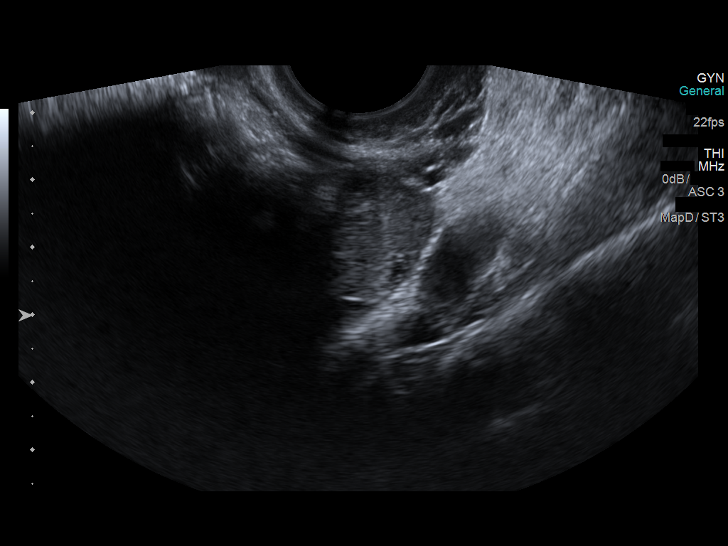
[im 101/101]
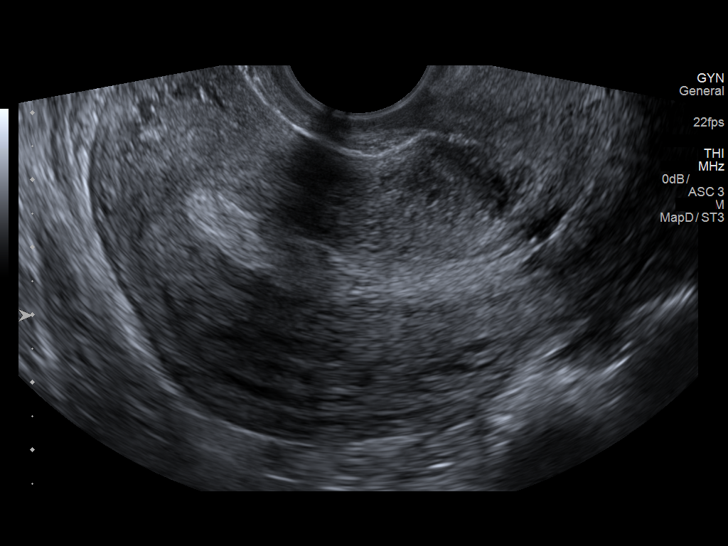

[13 of 25 positions shown; findings below may reference images not displayed]

FINDINGS: Uterus

Measurements: 8.8 x 3.8 x 5.7 cm. No fibroids or other mass
visualized.

Endometrium

Thickness: 9.7 mm. No abnormal endometrial fluid or soft tissue
masses or fragments are observed.

Right ovary

Measurements: 4.0 x 1.3 x 3.3 cm. Normal appearance/no adnexal mass.

Left ovary

Measurements: 3.1 x 1.9 x 2.5 cm. There is a subtle hypoechoic focus
in the left ovary which is homogeneous in appearance which measures
2.4 x 1.3 x 2.0 cm. There is some vascularity surrounding it but
none is observed within it.

Other findings

No free pelvic fluid.
IMPRESSION: Normal appearing uterus and endometrium. No evidence of retained
products of conception.

Normal appearing right ovary. The left ovary exhibits a
solid-appearing slightly hypoechoic focus without internal
vascularity. This may reflect a hemorrhagic cyst. Follow-up
ultrasound in 6-12 weeks is recommended.

## 2020-01-29 IMAGING — DX DG CHEST 2V
2 series · 2 of 2 positions shown · non-contrast
Comparison: 06/12/2017

CLINICAL DATA: Left-sided dental pain starting a few days ago now
presents with left chest pain.

EXAM:
CHEST - 2 VIEW

[chest pa]
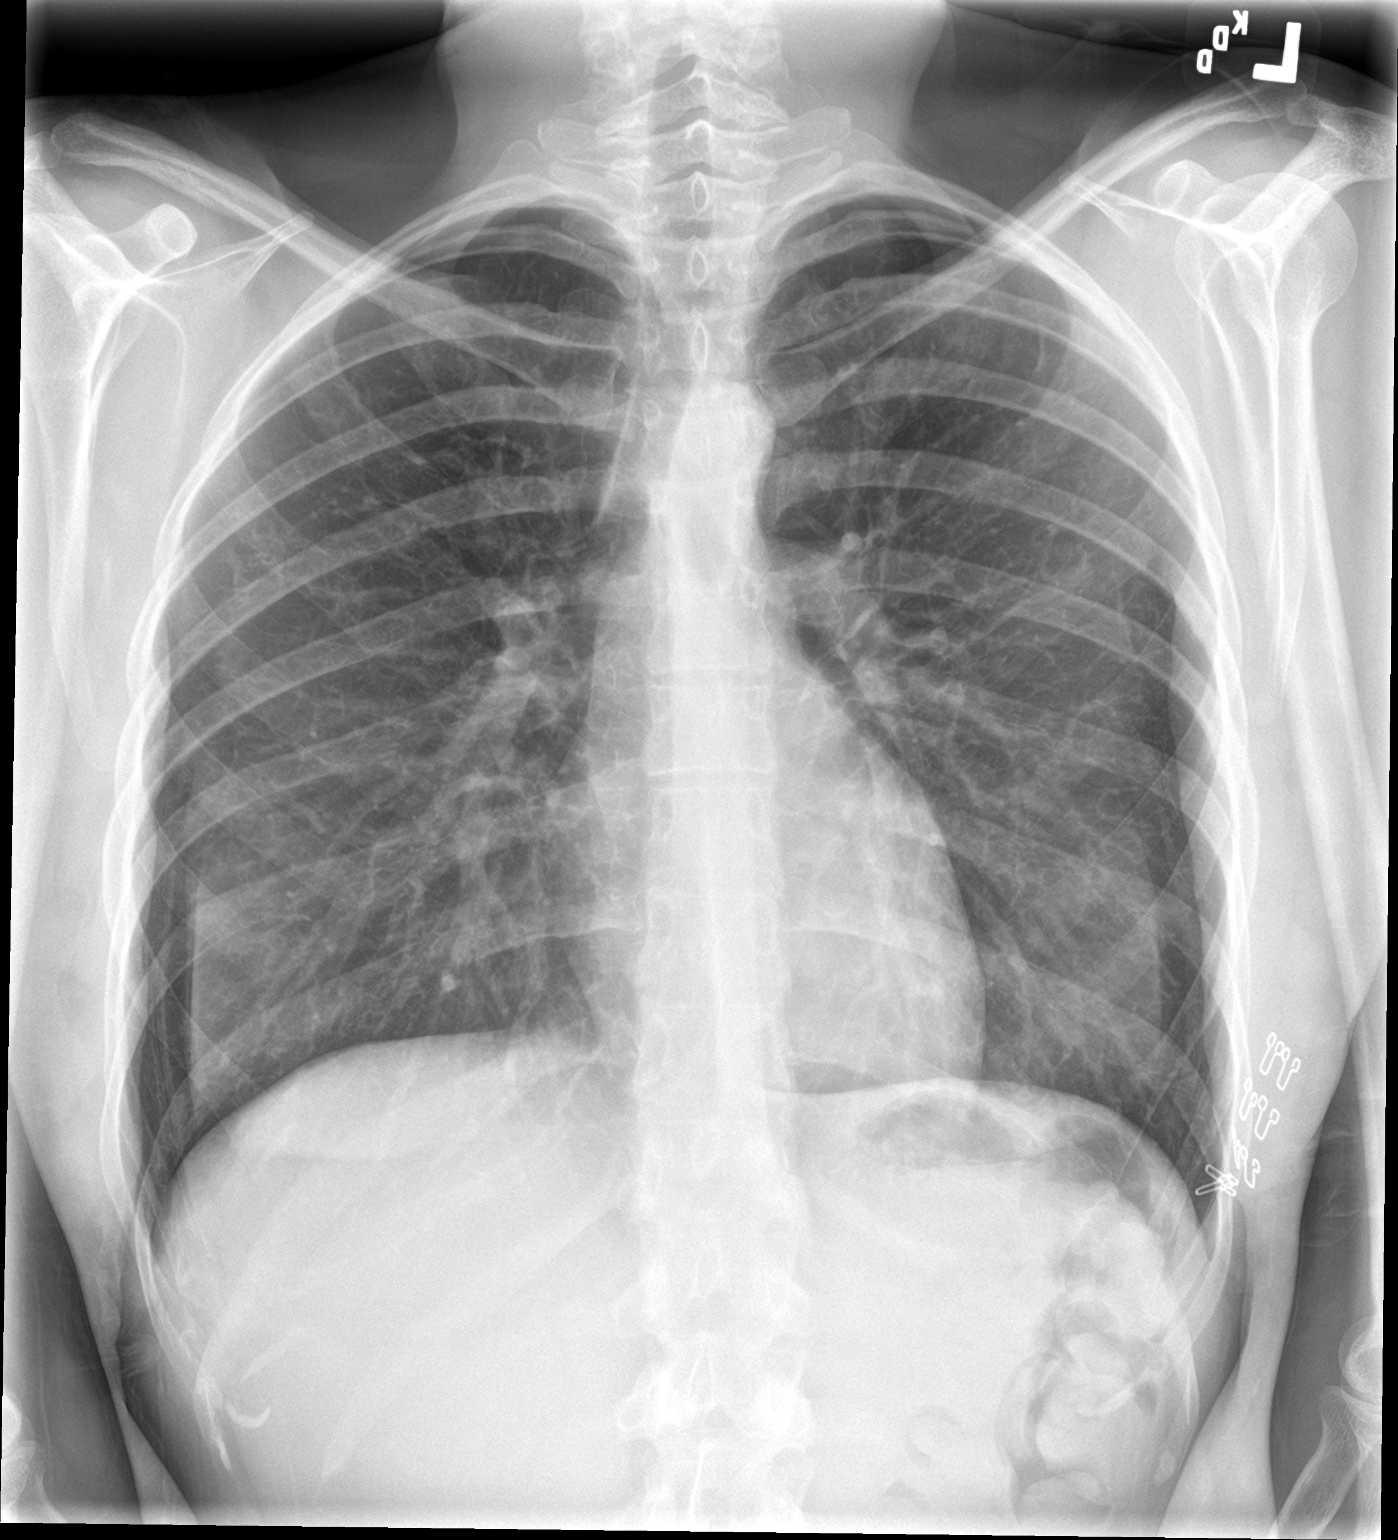

[chest lat]
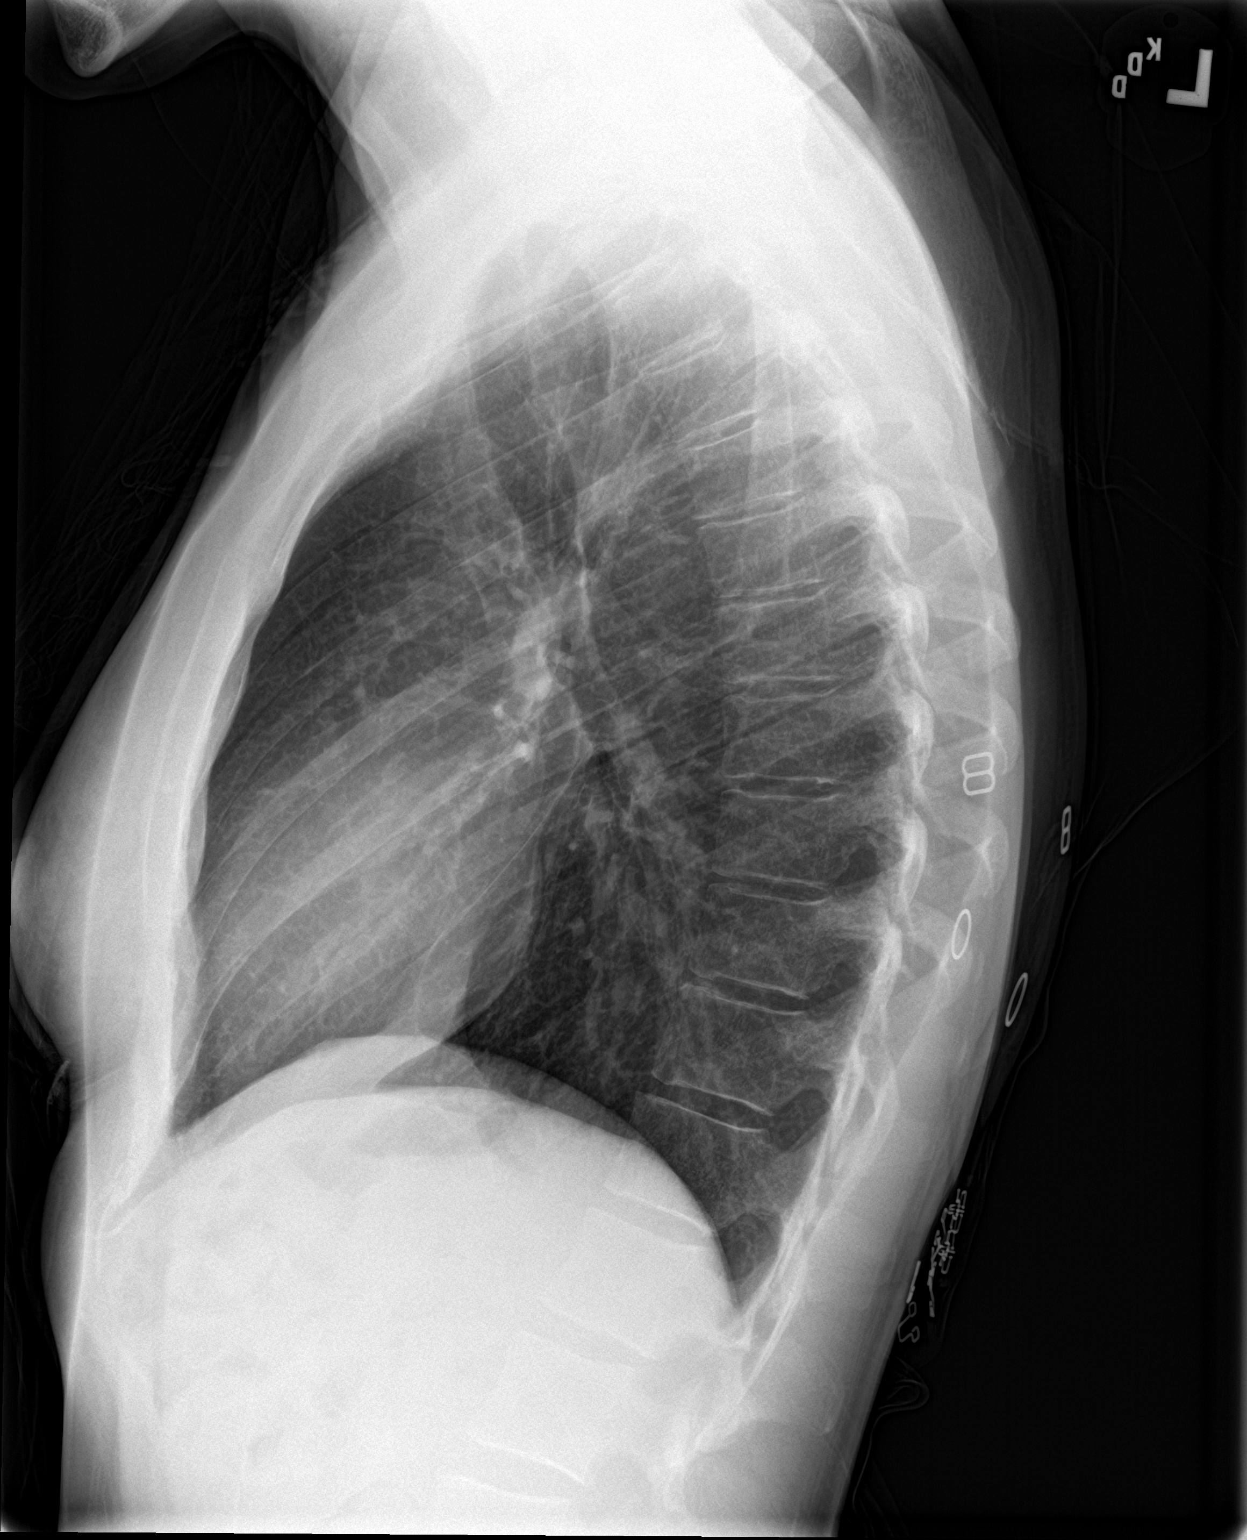

[2 of 2 positions shown; findings below may reference images not displayed]

FINDINGS: The heart size and mediastinal contours are within normal limits.
Peripheral lucencies about both lungs compatible with artifacts due
to superimposition of the patient's arms over the periphery of the
chest. These are not felt to represent bilateral pneumothoraces. No
pulmonary consolidation or CHF. No effusion or pneumothorax. No
acute osseous abnormality.
IMPRESSION: No active cardiopulmonary disease. Arm artifacts account for the
lucencies involving the periphery of both lungs.
